# Patient Record
Sex: Female | Born: 1990 | Race: Black or African American | Hispanic: No | Marital: Single | Smoking: Former smoker
Health system: Southern US, Community
[De-identification: ages and names within clinical notes are randomized; demographics above are authoritative.]

## PROBLEM LIST (undated history)

## (undated) ENCOUNTER — Inpatient Hospital Stay (HOSPITAL_COMMUNITY): Payer: Self-pay

## (undated) DIAGNOSIS — R519 Headache, unspecified: Secondary | ICD-10-CM

## (undated) DIAGNOSIS — K2970 Gastritis, unspecified, without bleeding: Secondary | ICD-10-CM

## (undated) DIAGNOSIS — B999 Unspecified infectious disease: Secondary | ICD-10-CM

## (undated) DIAGNOSIS — N83209 Unspecified ovarian cyst, unspecified side: Secondary | ICD-10-CM

## (undated) DIAGNOSIS — D649 Anemia, unspecified: Secondary | ICD-10-CM

## (undated) DIAGNOSIS — R51 Headache: Secondary | ICD-10-CM

## (undated) DIAGNOSIS — R87629 Unspecified abnormal cytological findings in specimens from vagina: Secondary | ICD-10-CM

## (undated) DIAGNOSIS — R112 Nausea with vomiting, unspecified: Secondary | ICD-10-CM

## (undated) DIAGNOSIS — B001 Herpesviral vesicular dermatitis: Secondary | ICD-10-CM

## (undated) DIAGNOSIS — B009 Herpesviral infection, unspecified: Secondary | ICD-10-CM

## (undated) DIAGNOSIS — F12988 Cannabis use, unspecified with other cannabis-induced disorder: Secondary | ICD-10-CM

## (undated) HISTORY — PX: DILATION AND CURETTAGE OF UTERUS: SHX78

---
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
## 2010-09-20 LAB — WET PREP, GENITAL
Clue Cells Wet Prep HPF POC: NONE SEEN
Trich, Wet Prep: NONE SEEN
Yeast Wet Prep HPF POC: NONE SEEN

## 2010-09-20 LAB — LIPASE, BLOOD: Lipase: 13 U/L (ref 11–59)

## 2010-09-20 LAB — DIFFERENTIAL
Basophils Absolute: 0 10*3/uL (ref 0.0–0.1)
Lymphocytes Relative: 32 % (ref 24–48)
Lymphs Abs: 3 10*3/uL (ref 1.1–4.8)
Monocytes Absolute: 0.9 10*3/uL (ref 0.2–1.2)
Neutro Abs: 5.4 10*3/uL (ref 1.7–8.0)

## 2010-09-20 LAB — COMPREHENSIVE METABOLIC PANEL
AST: 30 U/L (ref 0–37)
Albumin: 4.2 g/dL (ref 3.5–5.2)
BUN: 6 mg/dL (ref 6–23)
Calcium: 9.5 mg/dL (ref 8.4–10.5)
Chloride: 100 mEq/L (ref 96–112)
Creatinine, Ser: 0.81 mg/dL (ref 0.4–1.2)
Total Bilirubin: 0.9 mg/dL (ref 0.3–1.2)

## 2010-09-20 LAB — URINALYSIS, ROUTINE W REFLEX MICROSCOPIC
Nitrite: NEGATIVE
Specific Gravity, Urine: 1.017 (ref 1.005–1.030)
Urobilinogen, UA: 1 mg/dL (ref 0.0–1.0)
pH: 6.5 (ref 5.0–8.0)

## 2010-09-20 LAB — URINE CULTURE: Culture: NO GROWTH

## 2010-09-20 LAB — CBC
HCT: 38.7 % (ref 36.0–49.0)
MCHC: 33.1 g/dL (ref 31.0–37.0)
MCV: 83.5 fL (ref 78.0–98.0)
Platelets: 269 10*3/uL (ref 150–400)
WBC: 9.4 10*3/uL (ref 4.5–13.5)

## 2010-09-20 LAB — URINE MICROSCOPIC-ADD ON

## 2012-05-26 ENCOUNTER — Encounter (HOSPITAL_BASED_OUTPATIENT_CLINIC_OR_DEPARTMENT_OTHER): Payer: Self-pay | Admitting: Emergency Medicine

## 2012-05-26 ENCOUNTER — Emergency Department (HOSPITAL_BASED_OUTPATIENT_CLINIC_OR_DEPARTMENT_OTHER)
Admission: EM | Admit: 2012-05-26 | Discharge: 2012-05-26 | Disposition: A | Discharge status: Home / Self Care | Payer: Self-pay | Attending: Emergency Medicine | Admitting: Emergency Medicine

## 2012-05-26 DIAGNOSIS — R112 Nausea with vomiting, unspecified: Secondary | ICD-10-CM | POA: Insufficient documentation

## 2012-05-26 DIAGNOSIS — Z79899 Other long term (current) drug therapy: Secondary | ICD-10-CM | POA: Insufficient documentation

## 2012-05-26 DIAGNOSIS — R1084 Generalized abdominal pain: Secondary | ICD-10-CM | POA: Insufficient documentation

## 2012-05-26 DIAGNOSIS — R109 Unspecified abdominal pain: Secondary | ICD-10-CM

## 2012-05-26 DIAGNOSIS — R197 Diarrhea, unspecified: Secondary | ICD-10-CM | POA: Insufficient documentation

## 2012-05-26 DIAGNOSIS — Z8619 Personal history of other infectious and parasitic diseases: Secondary | ICD-10-CM | POA: Insufficient documentation

## 2012-05-26 DIAGNOSIS — Z8719 Personal history of other diseases of the digestive system: Secondary | ICD-10-CM | POA: Insufficient documentation

## 2012-05-26 HISTORY — DX: Herpesviral vesicular dermatitis: B00.1

## 2012-05-26 HISTORY — DX: Gastritis, unspecified, without bleeding: K29.70

## 2012-05-26 LAB — URINALYSIS, ROUTINE W REFLEX MICROSCOPIC
Leukocytes, UA: NEGATIVE
Nitrite: NEGATIVE
Specific Gravity, Urine: 1.015 (ref 1.005–1.030)
Urobilinogen, UA: 1 mg/dL (ref 0.0–1.0)
pH: 6.5 (ref 5.0–8.0)

## 2012-05-26 LAB — PREGNANCY, URINE: Preg Test, Ur: NEGATIVE

## 2012-05-26 LAB — URINE MICROSCOPIC-ADD ON

## 2012-05-26 MED ORDER — OXYCODONE-ACETAMINOPHEN 5-325 MG PO TABS
1.0000 | ORAL_TABLET | Freq: Once | ORAL | Status: AC
  Administered 2012-05-26: 1 via ORAL
  Filled 2012-05-26 (×2): qty 1

## 2012-05-26 MED ORDER — ONDANSETRON 8 MG PO TBDP
8.0000 mg | ORAL_TABLET | Freq: Once | ORAL | Status: AC
  Administered 2012-05-26: 8 mg via ORAL
  Filled 2012-05-26: qty 1

## 2012-05-26 MED ORDER — PROMETHAZINE HCL 25 MG PO TABS: 25.0000 mg | ORAL_TABLET | Freq: Four times a day (QID) | ORAL | Status: DC | PRN

## 2012-05-26 NOTE — ED Provider Notes (Signed)
History     CSN: [id number]  Arrival date & time 05/26/12  1213   First MD Initiated Contact with Patient 05/26/12 1234      Chief Complaint  Patient presents with  . Abdominal Pain     Patient is a 21 y.o. female presenting with abdominal pain. The history is provided by the patient and a friend.  Abdominal Pain The primary symptoms of the illness include abdominal pain, nausea, vomiting and diarrhea. The primary symptoms of the illness do not include fever, fatigue, shortness of breath, dysuria or vaginal bleeding. The current episode started more than 2 days ago. The onset of the illness was gradual. The problem has been gradually worsening.  Additional symptoms associated with the illness include back pain. Symptoms associated with the illness do not include frequency.  pt reports she has had intermittent abdominal pain for past month . She reports in past 24 hours the pain has returned and she now has vomiting and diarrhea.  The stool is nonbloody.  No vag bleeding/discharge No dysuria She reports she has presented to [facility] 4 times in the past month with extensive workup without cause found for her abdominal pain.  She requests second opinion   Past Medical History  Diagnosis Date  . Gastritis   . Fever blister     Past Surgical History  Procedure Date  . Dilation and curettage of uterus   . Induced abortion     No family history on file.  History  Substance Use Topics  . Smoking status: Not on file  . Smokeless tobacco: Not on file  . Alcohol Use:     OB History    Grav Para Term Preterm Abortions TAB SAB Ect Mult Living                  Review of Systems  Constitutional: Negative for fever and fatigue.  Respiratory: Negative for shortness of breath.   Gastrointestinal: Positive for nausea, vomiting, abdominal pain and diarrhea.  Genitourinary: Negative for dysuria, frequency and vaginal bleeding.  Musculoskeletal: Positive for back pain.   Neurological: Negative for weakness.  Psychiatric/Behavioral: Negative for agitation.  All other systems reviewed and are negative.    Allergies  Review of patient's allergies indicates no known allergies.  Home Medications   Current Outpatient Rx  Name  Route  Sig  Dispense  Refill  . VALACYCLOVIR HCL 1 G PO TABS   Oral   Take 1,000 mg by mouth 2 (two) times daily.           BP 113/77  Pulse 68  Temp 98.3 F (36.8 C) (Oral)  Resp 14  Ht 5\' 4"  (1.626 m)  Wt 130 lb (58.968 kg)  BMI 22.31 kg/m2  SpO2 100%  LMP 05/13/2012  Physical Exam CONSTITUTIONAL: Well developed/well nourished HEAD AND FACE: Normocephalic/atraumatic EYES: EOMI/PERRL, no icterus ENMT: Mucous membranes moist NECK: supple no meningeal signs SPINE:entire spine nontender CV: S1/S2 noted, no murmurs/rubs/gallops noted LUNGS: Lungs are clear to auscultation bilaterally, no apparent distress ABDOMEN: soft, nontender, no rebound or guarding, +BS GU:no cva tenderness NEURO: Pt is awake/alert, moves all extremitiesx4 EXTREMITIES: pulses normal, full ROM SKIN: warm, color normal PSYCH: no abnormalities of mood noted    ED Course  Procedures  Labs Reviewed  URINALYSIS, ROUTINE W REFLEX MICROSCOPIC - Abnormal; Notable for the following:    APPearance CLOUDY (*)     Hgb urine dipstick MODERATE (*)     All other components within  normal limits  URINE MICROSCOPIC-ADD ON - Abnormal; Notable for the following:    Squamous Epithelial / LPF FEW (*)     Bacteria, UA MANY (*)     All other components within normal limits  PREGNANCY, URINE   1:26 PM Pt h/o abd pain for past month She has been seen multiple times at [facility] Will call for those records She is well appearing She mentions 10lb wt loss over past month 1:45 PM [facility] ER visit 12/13 - cbc/cmp negative.  US abdomen performed and negative per records ER visit 05/13/12 - CT abd/pelvis - unremarkable on 12/1 ER visit - 04/29/12 -  labs performed 2:01 PM Discussed results from [facility] hospital.  Do not feel further workup indicated.  Only thing I would add is a pelvic exam (does not appear that she had one on recent ER visit) but she declines.  She reports most of her pain is epigastric burning into her chest.  She is well appearing/nontoxic She requests rx for phenergen.  Given GI referral  MDM  Nursing notes including past medical history and social history reviewed and considered in documentation Labs/vital reviewed and considered         Joya Gaskins, MD 05/26/12 1402

## 2012-05-26 NOTE — ED Notes (Signed)
Generalized abdominal pain, with nausea, and vomiting.  Some diarrhea.  No vaginal discharge.  Seen at [facility] for same for last month.

## 2012-12-10 ENCOUNTER — Encounter (HOSPITAL_COMMUNITY): Payer: Self-pay

## 2012-12-10 ENCOUNTER — Emergency Department (HOSPITAL_COMMUNITY): Payer: No Typology Code available for payment source

## 2012-12-10 ENCOUNTER — Emergency Department (HOSPITAL_COMMUNITY)
Admission: EM | Admit: 2012-12-10 | Discharge: 2012-12-10 | Disposition: A | Discharge status: Home / Self Care | Payer: No Typology Code available for payment source | Attending: Emergency Medicine | Admitting: Emergency Medicine

## 2012-12-10 DIAGNOSIS — S8990XA Unspecified injury of unspecified lower leg, initial encounter: Secondary | ICD-10-CM | POA: Insufficient documentation

## 2012-12-10 DIAGNOSIS — S161XXA Strain of muscle, fascia and tendon at neck level, initial encounter: Secondary | ICD-10-CM

## 2012-12-10 DIAGNOSIS — Y92410 Unspecified street and highway as the place of occurrence of the external cause: Secondary | ICD-10-CM | POA: Insufficient documentation

## 2012-12-10 DIAGNOSIS — S99919A Unspecified injury of unspecified ankle, initial encounter: Secondary | ICD-10-CM | POA: Insufficient documentation

## 2012-12-10 DIAGNOSIS — Z8719 Personal history of other diseases of the digestive system: Secondary | ICD-10-CM | POA: Insufficient documentation

## 2012-12-10 DIAGNOSIS — B009 Herpesviral infection, unspecified: Secondary | ICD-10-CM | POA: Insufficient documentation

## 2012-12-10 DIAGNOSIS — S139XXA Sprain of joints and ligaments of unspecified parts of neck, initial encounter: Secondary | ICD-10-CM | POA: Insufficient documentation

## 2012-12-10 DIAGNOSIS — Y939 Activity, unspecified: Secondary | ICD-10-CM | POA: Insufficient documentation

## 2012-12-10 DIAGNOSIS — Z79899 Other long term (current) drug therapy: Secondary | ICD-10-CM | POA: Insufficient documentation

## 2012-12-10 DIAGNOSIS — S298XXA Other specified injuries of thorax, initial encounter: Secondary | ICD-10-CM | POA: Insufficient documentation

## 2012-12-10 DIAGNOSIS — M25572 Pain in left ankle and joints of left foot: Secondary | ICD-10-CM

## 2012-12-10 HISTORY — DX: Herpesviral infection, unspecified: B00.9

## 2012-12-10 MED ORDER — OXYCODONE-ACETAMINOPHEN 5-325 MG PO TABS
1.0000 | ORAL_TABLET | Freq: Once | ORAL | Status: AC
  Administered 2012-12-10: 1 via ORAL
  Filled 2012-12-10: qty 1

## 2012-12-10 MED ORDER — IBUPROFEN 600 MG PO TABS: 600.0000 mg | ORAL_TABLET | Freq: Three times a day (TID) | ORAL | Status: DC | PRN

## 2012-12-10 NOTE — ED Notes (Signed)
Patient is resting comfortably. 
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
## 2014-11-12 ENCOUNTER — Encounter (HOSPITAL_BASED_OUTPATIENT_CLINIC_OR_DEPARTMENT_OTHER): Payer: Self-pay | Admitting: Emergency Medicine

## 2014-11-12 ENCOUNTER — Emergency Department (HOSPITAL_BASED_OUTPATIENT_CLINIC_OR_DEPARTMENT_OTHER)
Admission: EM | Admit: 2014-11-12 | Discharge: 2014-11-12 | Disposition: A | Discharge status: Home / Self Care | Payer: Self-pay | Attending: Emergency Medicine | Admitting: Emergency Medicine

## 2014-11-12 DIAGNOSIS — K088 Other specified disorders of teeth and supporting structures: Secondary | ICD-10-CM | POA: Insufficient documentation

## 2014-11-12 DIAGNOSIS — Z8719 Personal history of other diseases of the digestive system: Secondary | ICD-10-CM | POA: Insufficient documentation

## 2014-11-12 DIAGNOSIS — Z79899 Other long term (current) drug therapy: Secondary | ICD-10-CM | POA: Insufficient documentation

## 2014-11-12 DIAGNOSIS — K0889 Other specified disorders of teeth and supporting structures: Secondary | ICD-10-CM

## 2014-11-12 DIAGNOSIS — Z720 Tobacco use: Secondary | ICD-10-CM | POA: Insufficient documentation

## 2014-11-12 DIAGNOSIS — Z8619 Personal history of other infectious and parasitic diseases: Secondary | ICD-10-CM | POA: Insufficient documentation

## 2014-11-12 MED ORDER — PENICILLIN V POTASSIUM 250 MG PO TABS: 250.0000 mg | ORAL_TABLET | Freq: Four times a day (QID) | ORAL | Status: AC

## 2014-11-12 NOTE — ED Notes (Signed)
Pt states tooth pain for about 1 week

## 2014-11-12 NOTE — ED Provider Notes (Signed)
CSN: [id number]     Arrival date & time 11/12/14  1755 History   First MD Initiated Contact with Patient 11/12/14 1801     Chief Complaint  Patient presents with  . Dental Pain     (Consider location/radiation/quality/duration/timing/severity/associated sxs/prior Treatment) HPI Comments: Pt comes in with c/o left lower tooth fracture that happened about 1 week ago and pt states that in the last couple of days the pain has gotten worse. No history of problems with that tooth. No facial swelling or problems swallowing or breathing  The history is provided by the patient. No language interpreter was used.    Past Medical History  Diagnosis Date  . Gastritis   . Fever blister   . Herpes    Past Surgical History  Procedure Laterality Date  . Dilation and curettage of uterus    . Induced abortion     History reviewed. No pertinent family history. History  Substance Use Topics  . Smoking status: Current Every Day Smoker  . Smokeless tobacco: Not on file  . Alcohol Use: Yes   OB History    No data available     Review of Systems  All other systems reviewed and are negative.     Allergies  Cherry  Home Medications   Prior to Admission medications   Medication Sig Start Date End Date Taking? Authorizing Provider  ibuprofen (ADVIL,MOTRIN) 600 MG tablet Take 1 tablet (600 mg total) by mouth every 8 (eight) hours as needed for pain. 12/10/12   Azalia BilisKevin Campos, MD  penicillin v potassium (VEETID) 250 MG tablet Take 1 tablet (250 mg total) by mouth 4 (four) times daily. 11/12/14 11/19/14  Teressa LowerVrinda Lemmie Vanlanen, NP  valACYclovir (VALTREX) 1000 MG tablet Take 1,000 mg by mouth daily.     Historical Provider, MD   BP 120/80 mmHg  Pulse 86  Temp(Src) 98.5 F (36.9 C) (Oral)  Resp 18  Ht 5\' 4"  (1.626 m)  Wt 125 lb (56.7 kg)  BMI 21.45 kg/m2  SpO2 100%  LMP 11/12/2014 Physical Exam  Constitutional: She appears well-developed and well-nourished.  HENT:  Right Ear: External ear normal.    Mouth/Throat:    Cardiovascular: Normal rate and regular rhythm.   Pulmonary/Chest: Effort normal and breath sounds normal.  Musculoskeletal: Normal range of motion.  Nursing note and vitals reviewed.   ED Course  Procedures (including critical care time) Labs Review Labs Reviewed - No data to display  Imaging Review No results found.   EKG Interpretation None      MDM   Final diagnoses:  Toothache    Pt given script for pcn and dental referral. No sign of ludwigs angina   Teressa LowerVrinda Sharnelle Cappelli, NP 11/12/14 1817  Nelva Nayobert Beaton, MD 11/17/14 2157

## 2014-11-12 NOTE — Discharge Instructions (Signed)

## 2015-05-14 ENCOUNTER — Emergency Department (HOSPITAL_BASED_OUTPATIENT_CLINIC_OR_DEPARTMENT_OTHER)
Admission: EM | Admit: 2015-05-14 | Discharge: 2015-05-14 | Disposition: A | Discharge status: Home / Self Care | Payer: Self-pay | Attending: Emergency Medicine | Admitting: Emergency Medicine

## 2015-05-14 ENCOUNTER — Encounter (HOSPITAL_BASED_OUTPATIENT_CLINIC_OR_DEPARTMENT_OTHER): Payer: Self-pay | Admitting: *Deleted

## 2015-05-14 DIAGNOSIS — K029 Dental caries, unspecified: Secondary | ICD-10-CM | POA: Insufficient documentation

## 2015-05-14 DIAGNOSIS — K0889 Other specified disorders of teeth and supporting structures: Secondary | ICD-10-CM | POA: Insufficient documentation

## 2015-05-14 DIAGNOSIS — F17200 Nicotine dependence, unspecified, uncomplicated: Secondary | ICD-10-CM | POA: Insufficient documentation

## 2015-05-14 DIAGNOSIS — Z79899 Other long term (current) drug therapy: Secondary | ICD-10-CM | POA: Insufficient documentation

## 2015-05-14 DIAGNOSIS — Z8619 Personal history of other infectious and parasitic diseases: Secondary | ICD-10-CM | POA: Insufficient documentation

## 2015-05-14 DIAGNOSIS — Z8719 Personal history of other diseases of the digestive system: Secondary | ICD-10-CM | POA: Insufficient documentation

## 2015-05-14 MED ORDER — HYDROCODONE-ACETAMINOPHEN 5-325 MG PO TABS: 1.0000 | ORAL_TABLET | Freq: Four times a day (QID) | ORAL | Status: DC | PRN

## 2015-05-14 MED ORDER — PENICILLIN V POTASSIUM 250 MG PO TABS: 250.0000 mg | ORAL_TABLET | Freq: Four times a day (QID) | ORAL | Status: AC

## 2015-05-14 NOTE — ED Provider Notes (Addendum)
CSN: [id number]     Arrival date & time 05/14/15  1415 History   First MD Initiated Contact with Patient 05/14/15 1426     Chief Complaint  Patient presents with  . Dental Pain     (Consider location/radiation/quality/duration/timing/severity/associated sxs/prior Treatment) Patient is a 24 y.o. female presenting with tooth pain. The history is provided by the patient.  Dental Pain Location:  Lower Lower teeth location:  18/LL 2nd molar Quality:  Pressure-like, localized and pulsating Severity:  Severe Onset quality:  Gradual Duration:  3 days Timing:  Constant Progression:  Worsening Chronicity:  New Context: filling fell out   Context comment:  Patient has dental decay in her filling had come out. She saw her dentist and had the tooth extracted 3 days ago. She did not receive any antibiotics or pain control and since that time the pain has worsened with ongoing bleeding. Relieved by:  Nothing Worsened by:  Hot food/drink and cold food/drink Ineffective treatments:  Acetaminophen Associated symptoms: facial pain, facial swelling and oral bleeding   Associated symptoms: no difficulty swallowing, no drooling, no fever and no gum swelling     Past Medical History  Diagnosis Date  . Gastritis   . Fever blister   . Herpes    Past Surgical History  Procedure Laterality Date  . Dilation and curettage of uterus    . Induced abortion     No family history on file. Social History  Substance Use Topics  . Smoking status: Current Every Day Smoker  . Smokeless tobacco: None  . Alcohol Use: Yes   OB History    No data available     Review of Systems  Constitutional: Negative for fever.  HENT: Positive for facial swelling. Negative for drooling.   All other systems reviewed and are negative.     Allergies  Cherry  Home Medications   Prior to Admission medications   Medication Sig Start Date End Date Taking? Authorizing Provider  ibuprofen (ADVIL,MOTRIN) 600 MG  tablet Take 1 tablet (600 mg total) by mouth every 8 (eight) hours as needed for pain. 12/10/12   Azalia Bilis, MD  valACYclovir (VALTREX) 1000 MG tablet Take 1,000 mg by mouth daily.     Historical Provider, MD   BP 129/82 mmHg  Pulse 88  Temp(Src) 98.2 F (36.8 C) (Oral)  Resp 16  SpO2 98%  LMP 05/11/2015 Physical Exam  Constitutional: She is oriented to person, place, and time. She appears well-developed and well-nourished.  Appears uncomfortable  HENT:  Head: Normocephalic and atraumatic.  Mouth/Throat:    Cardiovascular: Normal rate.   Pulmonary/Chest: Effort normal.  Neurological: She is alert and oriented to person, place, and time.  Skin: Skin is warm and dry.  Psychiatric: She has a normal mood and affect. Her behavior is normal.  Nursing note and vitals reviewed.   ED Course  Procedures (including critical care time) Labs Review Labs Reviewed - No data to display  Imaging Review No results found. I have personally reviewed and evaluated these images and lab results as part of my medical decision-making.   EKG Interpretation None      MDM   Final diagnoses:  Pain, dental   patient with tooth extraction 2 days ago with ongoing pain and bleeding. She has been packing it however that is not helping. The tooth was apparently decayed when it was removed. On exam patient has no current bleeding or signs for abscess however will treat with penicillin and pain control  Gwyneth SproutWhitney Jerel Sardina, MD 05/14/15 1440  Gwyneth SproutWhitney Elbia Paro, MD 05/14/15 [phone]

## 2015-05-14 NOTE — ED Notes (Signed)
She had a molar pulled 2 days ago. Pain and bleeding since.

## 2015-07-14 ENCOUNTER — Emergency Department (HOSPITAL_BASED_OUTPATIENT_CLINIC_OR_DEPARTMENT_OTHER)
Admission: EM | Admit: 2015-07-14 | Discharge: 2015-07-14 | Disposition: A | Discharge status: Home / Self Care | Payer: Self-pay | Attending: Emergency Medicine | Admitting: Emergency Medicine

## 2015-07-14 ENCOUNTER — Emergency Department (HOSPITAL_BASED_OUTPATIENT_CLINIC_OR_DEPARTMENT_OTHER): Payer: Self-pay

## 2015-07-14 ENCOUNTER — Encounter (HOSPITAL_BASED_OUTPATIENT_CLINIC_OR_DEPARTMENT_OTHER): Payer: Self-pay | Admitting: Emergency Medicine

## 2015-07-14 DIAGNOSIS — O200 Threatened abortion: Secondary | ICD-10-CM | POA: Insufficient documentation

## 2015-07-14 DIAGNOSIS — O209 Hemorrhage in early pregnancy, unspecified: Secondary | ICD-10-CM

## 2015-07-14 DIAGNOSIS — Z8719 Personal history of other diseases of the digestive system: Secondary | ICD-10-CM | POA: Insufficient documentation

## 2015-07-14 DIAGNOSIS — Z3A01 Less than 8 weeks gestation of pregnancy: Secondary | ICD-10-CM | POA: Insufficient documentation

## 2015-07-14 DIAGNOSIS — F17200 Nicotine dependence, unspecified, uncomplicated: Secondary | ICD-10-CM | POA: Insufficient documentation

## 2015-07-14 DIAGNOSIS — Z79899 Other long term (current) drug therapy: Secondary | ICD-10-CM | POA: Insufficient documentation

## 2015-07-14 DIAGNOSIS — Z8619 Personal history of other infectious and parasitic diseases: Secondary | ICD-10-CM | POA: Insufficient documentation

## 2015-07-14 DIAGNOSIS — O99331 Smoking (tobacco) complicating pregnancy, first trimester: Secondary | ICD-10-CM | POA: Insufficient documentation

## 2015-07-14 LAB — BASIC METABOLIC PANEL
ANION GAP: 9 (ref 5–15)
BUN: 6 mg/dL (ref 6–20)
CALCIUM: 8.8 mg/dL — AB (ref 8.9–10.3)
CO2: 24 mmol/L (ref 22–32)
CREATININE: 0.59 mg/dL (ref 0.44–1.00)
Chloride: 103 mmol/L (ref 101–111)
GFR calc non Af Amer: >60 mL/min (ref >60)
Glucose, Bld: 101 mg/dL — ABNORMAL HIGH (ref 65–99)
Potassium: 3.4 mmol/L — ABNORMAL LOW (ref 3.5–5.1)
SODIUM: 136 mmol/L (ref 135–145)

## 2015-07-14 LAB — CBC WITH DIFFERENTIAL/PLATELET
BASOS ABS: 0 10*3/uL (ref 0.0–0.1)
BASOS PCT: 0 %
EOS ABS: 0.2 10*3/uL (ref 0.0–0.7)
Eosinophils Relative: 2 %
HEMATOCRIT: 33.3 % — AB (ref 36.0–46.0)
HEMOGLOBIN: 11 g/dL — AB (ref 12.0–15.0)
Lymphocytes Relative: 32 %
Lymphs Abs: 3 10*3/uL (ref 0.7–4.0)
MCH: 27.8 pg (ref 26.0–34.0)
MCHC: 33 g/dL (ref 30.0–36.0)
MCV: 84.3 fL (ref 78.0–100.0)
MONOS PCT: 12 %
Monocytes Absolute: 1.1 10*3/uL — ABNORMAL HIGH (ref 0.1–1.0)
NEUTROS ABS: 4.9 10*3/uL (ref 1.7–7.7)
NEUTROS PCT: 54 %
Platelets: 262 10*3/uL (ref 150–400)
RBC: 3.95 MIL/uL (ref 3.87–5.11)
RDW: 15.5 % (ref 11.5–15.5)
WBC: 9.2 10*3/uL (ref 4.0–10.5)

## 2015-07-14 LAB — URINE MICROSCOPIC-ADD ON: WBC UA: NONE SEEN WBC/hpf (ref 0–5)

## 2015-07-14 LAB — URINALYSIS, ROUTINE W REFLEX MICROSCOPIC
Bilirubin Urine: NEGATIVE
GLUCOSE, UA: NEGATIVE mg/dL
KETONES UR: NEGATIVE mg/dL
LEUKOCYTES UA: NEGATIVE
Nitrite: NEGATIVE
PH: 7 (ref 5.0–8.0)
Protein, ur: NEGATIVE mg/dL
Specific Gravity, Urine: 1.002 — ABNORMAL LOW (ref 1.005–1.030)

## 2015-07-14 LAB — ABO/RH: ABO/RH(D): O POS

## 2015-07-14 LAB — PREGNANCY, URINE: Preg Test, Ur: POSITIVE — AB

## 2015-07-14 LAB — HCG, QUANTITATIVE, PREGNANCY: HCG, BETA CHAIN, QUANT, S: 324 m[IU]/mL — AB (ref <5)

## 2015-07-14 NOTE — Discharge Instructions (Signed)
Follow-up with an obstetrician in the next 2-3 days. Please call to arrange this appointment.  Return to the ER if symptoms significantly worsen or change.   Threatened Miscarriage A threatened miscarriage occurs when you have vaginal bleeding during your first 20 weeks of pregnancy but the pregnancy has not ended. If you have vaginal bleeding during this time, your health care provider will do tests to make sure you are still pregnant. If the tests show you are still pregnant and the developing baby (fetus) inside your womb (uterus) is still growing, your condition is considered a threatened miscarriage. A threatened miscarriage does not mean your pregnancy will end, but it does increase the risk of losing your pregnancy (complete miscarriage). CAUSES  The cause of a threatened miscarriage is usually not known. If you go on to have a complete miscarriage, the most common cause is an abnormal number of chromosomes in the developing baby. Chromosomes are the structures inside cells that hold all your genetic material. Some causes of vaginal bleeding that do not result in miscarriage include:  Having sex.  Having an infection.  Normal hormone changes of pregnancy.  Bleeding that occurs when an egg implants in your uterus. RISK FACTORS Risk factors for bleeding in early pregnancy include:  Obesity.  Smoking.  Drinking excessive amounts of alcohol or caffeine.  Recreational drug use. SIGNS AND SYMPTOMS  Light vaginal bleeding.  Mild abdominal pain or cramps. DIAGNOSIS  If you have bleeding with or without abdominal pain before 20 weeks of pregnancy, your health care provider will do tests to check whether you are still pregnant. One important test involves using sound waves and a computer (ultrasound) to create images of the inside of your uterus. Other tests include an internal exam of your vagina and uterus (pelvic exam) and measurement of your baby's heart rate.  You may be  diagnosed with a threatened miscarriage if:  Ultrasound testing shows you are still pregnant.  Your baby's heart rate is strong.  A pelvic exam shows that the opening between your uterus and your vagina (cervix) is closed.  Your heart rate and blood pressure are stable.  Blood tests confirm you are still pregnant. TREATMENT  No treatments have been shown to prevent a threatened miscarriage from going on to a complete miscarriage. However, the right home care is important.  HOME CARE INSTRUCTIONS   Make sure you keep all your appointments for prenatal care. This is very important.  Get plenty of rest.  Do not have sex or use tampons if you have vaginal bleeding.  Do not douche.  Do not smoke or use recreational drugs.  Do not drink alcohol.  Avoid caffeine. SEEK MEDICAL CARE IF:  You have light vaginal bleeding or spotting while pregnant.  You have abdominal pain or cramping.  You have a fever. SEEK IMMEDIATE MEDICAL CARE IF:  You have heavy vaginal bleeding.  You have blood clots coming from your vagina.  You have severe low back pain or abdominal cramps.  You have fever, chills, and severe abdominal pain. MAKE SURE YOU:  Understand these instructions.  Will watch your condition.  Will get help right away if you are not doing well or get worse.   This information is not intended to replace advice given to you by your health care provider. Make sure you discuss any questions you have with your health care provider.   Document Released: 05/30/2005 Document Revised: 06/04/2013 Document Reviewed: 03/26/2013 Elsevier Interactive Patient Education [employer]

## 2015-07-14 NOTE — ED Notes (Signed)
Pt in c/o vaginal bleeding onset today after having 3 positive pregnancy tests. Also has cramping like menstrual cramps.

## 2015-07-14 NOTE — ED Provider Notes (Signed)
CSN: [id number]     Arrival date & time 07/14/15  1948 History  By signing my name below, I, Sarah Johns, attest that this documentation has been prepared under the direction and in the presence of Sarah Lyons, MD. Electronically Signed: Doreatha Johns, ED Scribe. 07/14/2015. 8:54 PM.     Chief Complaint  Patient presents with  . Vaginal Bleeding  . Possible Pregnancy   Patient is a 25 y.o. female presenting with vaginal bleeding and pregnancy problem. The history is provided by the patient. No language interpreter was used.  Vaginal Bleeding Quality:  Heavier than menses Severity:  Moderate Onset quality:  Gradual Duration:  12 hours Timing:  Constant Progression:  Improving Chronicity:  New Menstrual history:  Missed period Number of pads used:  3 Possible pregnancy: yes   Context: spontaneously   Relieved by:  None tried Worsened by:  Nothing tried Ineffective treatments:  None tried Associated symptoms: abdominal pain   Associated symptoms: no back pain   Risk factors: unprotected sex   Risk factors: no hx of ectopic pregnancy, no prior miscarriage and no terminated pregnancies   Possible Pregnancy Associated symptoms include abdominal pain.    HPI Comments: Sarah Johns is a 25 y.o. female No obstetric history on file. who presents to the Emergency Department complaining of moderate vaginal bleeding onset this evening with associated lower abdominal cramping. She states her bleeding was heavy initially and has eased off since onset. Per pt, she took 3 positive pregnancy tests this morning before the onset of bleeding. Pt notes she was not actively trying to conceive. LNMP 05/28/15. Pt notes that she has an appointment with her OB/GYN 07/22/15. Otherwise healthy. No daily medications. She denies back pain.   Past Medical History  Diagnosis Date  . Gastritis   . Fever blister   . Herpes    Past Surgical History  Procedure Laterality Date  . Dilation and curettage of  uterus    . Induced abortion     History reviewed. No pertinent family history. Social History  Substance Use Topics  . Smoking status: Current Every Day Smoker  . Smokeless tobacco: None  . Alcohol Use: Yes   OB History    No data available     Review of Systems  Gastrointestinal: Positive for abdominal pain.  Genitourinary: Positive for vaginal bleeding.  Musculoskeletal: Negative for back pain.  All other systems reviewed and are negative.  Allergies  Cherry  Home Medications   Prior to Admission medications   Medication Sig Start Date End Date Taking? Authorizing Provider  HYDROcodone-acetaminophen (NORCO/VICODIN) 5-325 MG tablet Take 1-2 tablets by mouth every 6 (six) hours as needed. 05/14/15   Gwyneth Sprout, MD  ibuprofen (ADVIL,MOTRIN) 600 MG tablet Take 1 tablet (600 mg total) by mouth every 8 (eight) hours as needed for pain. 12/10/12   Azalia Bilis, MD  valACYclovir (VALTREX) 1000 MG tablet Take 1,000 mg by mouth daily.     Historical Provider, MD   BP 128/86 mmHg  Pulse 96  Temp(Src) 98.6 F (37 C) (Oral)  Resp 18  Ht  (1.626 m)  Wt 136 lb (61.689 kg)  BMI 23.33 kg/m2  SpO2 100% Physical Exam  Constitutional: She is oriented to person, place, and time. She appears well-developed and well-nourished. No distress.  HENT:  Head: Normocephalic and atraumatic.  Eyes: Conjunctivae and EOM are normal. Pupils are equal, round, and reactive to light.  Neck: Normal range of motion.  Cardiovascular: Normal rate, regular rhythm  and normal heart sounds.   Pulmonary/Chest: Effort normal and breath sounds normal. No respiratory distress. She has no wheezes. She has no rales.  Abdominal: Soft. She exhibits no distension and no mass. There is no tenderness. There is no rebound and no guarding.  Musculoskeletal: Normal range of motion.  Neurological: She is alert and oriented to person, place, and time.  Skin: Skin is warm and dry.  Psychiatric: She has a normal  mood and affect. Judgment normal.  Nursing note and vitals reviewed.   ED Course  Procedures (including critical care time) DIAGNOSTIC STUDIES: Oxygen Saturation is 100% on RA, normal by my interpretation.    COORDINATION OF CARE: 8:49 PM Discussed treatment plan with pt at bedside and pt agreed to plan.   Labs Review Labs Reviewed  URINALYSIS, ROUTINE W REFLEX MICROSCOPIC (NOT AT [facility]) - Abnormal; Notable for the following:    Specific Gravity, Urine 1.002 (*)    Hgb urine dipstick MODERATE (*)    All other components within normal limits  PREGNANCY, URINE - Abnormal; Notable for the following:    Preg Test, Ur POSITIVE (*)    All other components within normal limits  URINE MICROSCOPIC-ADD ON - Abnormal; Notable for the following:    Squamous Epithelial / LPF 0-5 (*)    Bacteria, UA RARE (*)    All other components within normal limits  HCG, QUANTITATIVE, PREGNANCY  BASIC METABOLIC PANEL  CBC WITH DIFFERENTIAL/PLATELET  ABO/RH    Imaging Review No results found. I have personally reviewed and evaluated these images and lab results as part of my medical decision-making.  MDM   Final diagnoses:  None    Patient presents here with complaints of vaginal bleeding and cramping. She took a home pregnancy test which was positive. Her last period was at Christmas time. Her quantitative beta hCG is 324 and ultrasound shows a probable intrauterine gestational sac, but no yolk sac, fetal pole, or cardiac activity. I am uncertain as to whether this represents an early pregnancy or possibly a miscarriage in evolution. She will be advised to follow-up with an OB doctor in the next 2-3 days for repeat beta hCG and ultrasound. She is to adhere to vaginal rest and go to the emergency department if she develops severe pain or other problems.  I personally performed the services described in this documentation, which was scribed in my presence. The recorded information has been reviewed and  is accurate.       Sarah Lyons, MD 07/14/15 [phone]

## 2015-07-14 NOTE — ED Notes (Signed)
Pt verbalizes understanding of d/c instructions and denies any further needs at this time. 

## 2015-07-14 NOTE — ED Notes (Signed)
Patient transported to Ultrasound 

## 2015-07-14 NOTE — ED Notes (Signed)
Pt returned from US

## 2015-07-14 NOTE — ED Notes (Signed)
Pt's LMP was 06/07/15, c/o bleeding that started today with some light cramping.  No clots and pt has gone through 3 pads all day.

## 2015-09-21 ENCOUNTER — Encounter (HOSPITAL_COMMUNITY)
Admission: EM | Disposition: A | Discharge status: Home / Self Care | Payer: Self-pay | Source: Home / Self Care | Attending: Emergency Medicine

## 2015-09-21 ENCOUNTER — Emergency Department (HOSPITAL_BASED_OUTPATIENT_CLINIC_OR_DEPARTMENT_OTHER): Payer: Medicaid Other

## 2015-09-21 ENCOUNTER — Inpatient Hospital Stay (HOSPITAL_COMMUNITY): Payer: Medicaid Other | Admitting: Anesthesiology

## 2015-09-21 ENCOUNTER — Ambulatory Visit (HOSPITAL_BASED_OUTPATIENT_CLINIC_OR_DEPARTMENT_OTHER)
Admission: EM | Admit: 2015-09-21 | Discharge: 2015-09-21 | Disposition: A | Discharge status: Home / Self Care | Payer: Medicaid Other | Attending: Obstetrics & Gynecology | Admitting: Obstetrics & Gynecology

## 2015-09-21 ENCOUNTER — Inpatient Hospital Stay (HOSPITAL_COMMUNITY): Payer: Medicaid Other

## 2015-09-21 ENCOUNTER — Encounter (HOSPITAL_BASED_OUTPATIENT_CLINIC_OR_DEPARTMENT_OTHER): Payer: Self-pay | Admitting: *Deleted

## 2015-09-21 DIAGNOSIS — F17200 Nicotine dependence, unspecified, uncomplicated: Secondary | ICD-10-CM | POA: Insufficient documentation

## 2015-09-21 DIAGNOSIS — O26899 Other specified pregnancy related conditions, unspecified trimester: Secondary | ICD-10-CM

## 2015-09-21 DIAGNOSIS — O001 Tubal pregnancy without intrauterine pregnancy: Secondary | ICD-10-CM | POA: Diagnosis not present

## 2015-09-21 DIAGNOSIS — R102 Pelvic and perineal pain: Secondary | ICD-10-CM | POA: Diagnosis present

## 2015-09-21 DIAGNOSIS — R109 Unspecified abdominal pain: Secondary | ICD-10-CM

## 2015-09-21 DIAGNOSIS — R1031 Right lower quadrant pain: Secondary | ICD-10-CM

## 2015-09-21 HISTORY — PX: DIAGNOSTIC LAPAROSCOPY WITH REMOVAL OF ECTOPIC PREGNANCY: SHX6449

## 2015-09-21 LAB — TYPE AND SCREEN
ABO/RH(D): O POS
ANTIBODY SCREEN: NEGATIVE

## 2015-09-21 LAB — CBC WITH DIFFERENTIAL/PLATELET
BASOS ABS: 0 10*3/uL (ref 0.0–0.1)
BASOS PCT: 0 %
EOS ABS: 0.3 10*3/uL (ref 0.0–0.7)
Eosinophils Relative: 3 %
HEMATOCRIT: 32.5 % — AB (ref 36.0–46.0)
HEMOGLOBIN: 11 g/dL — AB (ref 12.0–15.0)
Lymphocytes Relative: 34 %
Lymphs Abs: 3 10*3/uL (ref 0.7–4.0)
MCH: 29.6 pg (ref 26.0–34.0)
MCHC: 33.8 g/dL (ref 30.0–36.0)
MCV: 87.4 fL (ref 78.0–100.0)
MONOS PCT: 11 %
Monocytes Absolute: 1 10*3/uL (ref 0.1–1.0)
NEUTROS ABS: 4.6 10*3/uL (ref 1.7–7.7)
NEUTROS PCT: 52 %
Platelets: 225 10*3/uL (ref 150–400)
RBC: 3.72 MIL/uL — AB (ref 3.87–5.11)
RDW: 15.6 % — ABNORMAL HIGH (ref 11.5–15.5)
WBC: 8.9 10*3/uL (ref 4.0–10.5)

## 2015-09-21 LAB — URINALYSIS, ROUTINE W REFLEX MICROSCOPIC
Bilirubin Urine: NEGATIVE
GLUCOSE, UA: NEGATIVE mg/dL
HGB URINE DIPSTICK: NEGATIVE
Ketones, ur: NEGATIVE mg/dL
Nitrite: NEGATIVE
PH: 7 (ref 5.0–8.0)
PROTEIN: NEGATIVE mg/dL
Specific Gravity, Urine: 1.007 (ref 1.005–1.030)

## 2015-09-21 LAB — COMPREHENSIVE METABOLIC PANEL
ALBUMIN: 4.3 g/dL (ref 3.5–5.0)
ALT: 15 U/L (ref 14–54)
ANION GAP: 4 — AB (ref 5–15)
AST: 23 U/L (ref 15–41)
Alkaline Phosphatase: 64 U/L (ref 38–126)
BUN: 6 mg/dL (ref 6–20)
CALCIUM: 9 mg/dL (ref 8.9–10.3)
CHLORIDE: 104 mmol/L (ref 101–111)
CO2: 27 mmol/L (ref 22–32)
CREATININE: 0.66 mg/dL (ref 0.44–1.00)
GFR calc non Af Amer: >60 mL/min (ref >60)
GLUCOSE: 93 mg/dL (ref 65–99)
POTASSIUM: 4 mmol/L (ref 3.5–5.1)
SODIUM: 135 mmol/L (ref 135–145)
Total Bilirubin: 0.6 mg/dL (ref 0.3–1.2)
Total Protein: 7.3 g/dL (ref 6.5–8.1)

## 2015-09-21 LAB — URINE MICROSCOPIC-ADD ON

## 2015-09-21 LAB — HCG, QUANTITATIVE, PREGNANCY: HCG, BETA CHAIN, QUANT, S: 637 m[IU]/mL — AB (ref <5)

## 2015-09-21 LAB — PREGNANCY, URINE: Preg Test, Ur: POSITIVE — AB

## 2015-09-21 SURGERY — LAPAROSCOPY, WITH ECTOPIC PREGNANCY SURGICAL TREATMENT
Anesthesia: General | Site: Abdomen

## 2015-09-21 MED ORDER — MIDAZOLAM HCL 2 MG/2ML IJ SOLN
INTRAMUSCULAR | Status: AC
  Filled 2015-09-21: qty 2

## 2015-09-21 MED ORDER — LACTATED RINGERS IV BOLUS (SEPSIS)
1000.0000 mL | Freq: Once | INTRAVENOUS | Status: AC
  Administered 2015-09-21: 1000 mL via INTRAVENOUS
  Administered 2015-09-21: 400 mL via INTRAVENOUS

## 2015-09-21 MED ORDER — ONDANSETRON HCL 4 MG/2ML IJ SOLN
INTRAMUSCULAR | Status: DC | PRN
  Administered 2015-09-21: 4 mg via INTRAVENOUS

## 2015-09-21 MED ORDER — FAMOTIDINE IN NACL 20-0.9 MG/50ML-% IV SOLN
INTRAVENOUS | Status: AC
  Administered 2015-09-21: 20 mg via INTRAVENOUS
  Filled 2015-09-21: qty 50

## 2015-09-21 MED ORDER — MIDAZOLAM HCL 5 MG/5ML IJ SOLN
INTRAMUSCULAR | Status: DC | PRN
  Administered 2015-09-21: 2 mg via INTRAVENOUS

## 2015-09-21 MED ORDER — GLYCOPYRROLATE 0.2 MG/ML IJ SOLN
INTRAMUSCULAR | Status: AC
  Filled 2015-09-21: qty 1

## 2015-09-21 MED ORDER — OXYCODONE-ACETAMINOPHEN 5-325 MG PO TABS
1.0000 | ORAL_TABLET | ORAL | Status: DC | PRN
  Administered 2015-09-21: 1 via ORAL

## 2015-09-21 MED ORDER — IBUPROFEN 600 MG PO TABS
600.0000 mg | ORAL_TABLET | Freq: Four times a day (QID) | ORAL | Status: DC | PRN
Start: 2015-09-21 — End: 2016-06-03

## 2015-09-21 MED ORDER — ONDANSETRON HCL 4 MG/2ML IJ SOLN: 4.0000 mg | Freq: Once | INTRAMUSCULAR | Status: DC | PRN

## 2015-09-21 MED ORDER — FENTANYL CITRATE (PF) 250 MCG/5ML IJ SOLN
INTRAMUSCULAR | Status: AC
  Filled 2015-09-21: qty 5

## 2015-09-21 MED ORDER — GLYCOPYRROLATE 0.2 MG/ML IJ SOLN
INTRAMUSCULAR | Status: DC | PRN
  Administered 2015-09-21: 0.2 mg via INTRAVENOUS
  Administered 2015-09-21: .8 mg via INTRAVENOUS

## 2015-09-21 MED ORDER — NEOSTIGMINE METHYLSULFATE 10 MG/10ML IV SOLN
INTRAVENOUS | Status: AC
  Filled 2015-09-21: qty 1

## 2015-09-21 MED ORDER — FENTANYL CITRATE (PF) 100 MCG/2ML IJ SOLN
25.0000 ug | INTRAMUSCULAR | Status: DC | PRN
  Administered 2015-09-21 (×3): 50 ug via INTRAVENOUS

## 2015-09-21 MED ORDER — GLYCOPYRROLATE 0.2 MG/ML IJ SOLN
INTRAMUSCULAR | Status: AC
  Filled 2015-09-21: qty 4

## 2015-09-21 MED ORDER — DEXAMETHASONE SODIUM PHOSPHATE 4 MG/ML IJ SOLN
INTRAMUSCULAR | Status: DC | PRN
  Administered 2015-09-21: 4 mg via INTRAVENOUS

## 2015-09-21 MED ORDER — OXYCODONE-ACETAMINOPHEN 5-325 MG PO TABS
ORAL_TABLET | ORAL | Status: AC
  Filled 2015-09-21: qty 1

## 2015-09-21 MED ORDER — LACTATED RINGERS IV SOLN
INTRAVENOUS | Status: DC
  Administered 2015-09-21 (×3): via INTRAVENOUS

## 2015-09-21 MED ORDER — SUCCINYLCHOLINE CHLORIDE 20 MG/ML IJ SOLN
INTRAMUSCULAR | Status: AC
  Filled 2015-09-21: qty 1

## 2015-09-21 MED ORDER — LIDOCAINE HCL (CARDIAC) 20 MG/ML IV SOLN
INTRAVENOUS | Status: AC
  Filled 2015-09-21: qty 5

## 2015-09-21 MED ORDER — ROCURONIUM BROMIDE 100 MG/10ML IV SOLN
INTRAVENOUS | Status: DC | PRN
  Administered 2015-09-21: 25 mg via INTRAVENOUS

## 2015-09-21 MED ORDER — FAMOTIDINE IN NACL 20-0.9 MG/50ML-% IV SOLN
20.0000 mg | Freq: Once | INTRAVENOUS | Status: AC
  Administered 2015-09-21: 20 mg via INTRAVENOUS

## 2015-09-21 MED ORDER — FENTANYL CITRATE (PF) 100 MCG/2ML IJ SOLN
INTRAMUSCULAR | Status: AC
  Filled 2015-09-21: qty 2

## 2015-09-21 MED ORDER — PROPOFOL 500 MG/50ML IV EMUL
INTRAVENOUS | Status: DC | PRN
  Administered 2015-09-21 (×2): 17 mL via INTRAVENOUS

## 2015-09-21 MED ORDER — NALBUPHINE HCL 10 MG/ML IJ SOLN
10.0000 mg | Freq: Once | INTRAMUSCULAR | Status: AC
  Administered 2015-09-21: 10 mg via INTRAVENOUS
  Filled 2015-09-21: qty 1

## 2015-09-21 MED ORDER — FENTANYL CITRATE (PF) 100 MCG/2ML IJ SOLN
INTRAMUSCULAR | Status: AC
  Administered 2015-09-21: 50 ug via INTRAVENOUS
  Filled 2015-09-21: qty 2

## 2015-09-21 MED ORDER — NEOSTIGMINE METHYLSULFATE 10 MG/10ML IV SOLN
INTRAVENOUS | Status: DC | PRN
  Administered 2015-09-21: 4 mg via INTRAVENOUS

## 2015-09-21 MED ORDER — LACTATED RINGERS IR SOLN
Status: DC | PRN
  Administered 2015-09-21: 3000 mL

## 2015-09-21 MED ORDER — DEXAMETHASONE SODIUM PHOSPHATE 4 MG/ML IJ SOLN
INTRAMUSCULAR | Status: AC
  Filled 2015-09-21: qty 1

## 2015-09-21 MED ORDER — HYDROCODONE-ACETAMINOPHEN 5-325 MG PO TABS: ORAL_TABLET | ORAL | Status: DC

## 2015-09-21 MED ORDER — CITRIC ACID-SODIUM CITRATE 334-500 MG/5ML PO SOLN: 30.0000 mL | ORAL | Status: DC

## 2015-09-21 MED ORDER — FENTANYL CITRATE (PF) 100 MCG/2ML IJ SOLN
INTRAMUSCULAR | Status: DC | PRN
  Administered 2015-09-21: 150 ug via INTRAVENOUS
  Administered 2015-09-21 (×2): 100 ug via INTRAVENOUS

## 2015-09-21 MED ORDER — SUCCINYLCHOLINE CHLORIDE 20 MG/ML IJ SOLN
INTRAMUSCULAR | Status: DC | PRN
  Administered 2015-09-21: 100 mg via INTRAVENOUS

## 2015-09-21 MED ORDER — ROCURONIUM BROMIDE 100 MG/10ML IV SOLN
INTRAVENOUS | Status: AC
  Filled 2015-09-21: qty 1

## 2015-09-21 MED ORDER — ONDANSETRON HCL 4 MG/2ML IJ SOLN
INTRAMUSCULAR | Status: AC
Start: 2015-09-21 — End: 2015-09-21
  Filled 2015-09-21: qty 2

## 2015-09-21 MED ORDER — LIDOCAINE HCL (CARDIAC) 20 MG/ML IV SOLN
INTRAVENOUS | Status: DC | PRN
  Administered 2015-09-21: 60 mg via INTRAVENOUS

## 2015-09-21 MED ORDER — FENTANYL CITRATE (PF) 100 MCG/2ML IJ SOLN
INTRAMUSCULAR | Status: AC
Start: 2015-09-21 — End: 2015-09-21
  Filled 2015-09-21: qty 2

## 2015-09-21 MED ORDER — PROPOFOL 10 MG/ML IV BOLUS
INTRAVENOUS | Status: AC
  Filled 2015-09-21: qty 20

## 2015-09-21 SURGICAL SUPPLY — 28 items
APPLICATOR COTTON TIP 6IN STRL (MISCELLANEOUS) ×3 IMPLANT
BLADE SURG 15 STRL LF C SS BP (BLADE) ×1 IMPLANT
BLADE SURG 15 STRL SS (BLADE) ×2
CLOTH BEACON ORANGE TIMEOUT ST (SAFETY) ×3 IMPLANT
DURAPREP 26ML APPLICATOR (WOUND CARE) ×3 IMPLANT
FILTER SMOKE EVAC LAPAROSHD (FILTER) ×3 IMPLANT
GLOVE BIO SURGEON STRL SZ7 (GLOVE) ×3 IMPLANT
GLOVE BIOGEL PI IND STRL 7.0 (GLOVE) ×2 IMPLANT
GLOVE BIOGEL PI INDICATOR 7.0 (GLOVE) ×4
GOWN STRL REUS W/TWL LRG LVL3 (GOWN DISPOSABLE) ×6 IMPLANT
NEEDLE INSUFFLATION 120MM (ENDOMECHANICALS) IMPLANT
NS IRRIG 1000ML POUR BTL (IV SOLUTION) ×3 IMPLANT
PACK LAPAROSCOPY BASIN (CUSTOM PROCEDURE TRAY) ×3 IMPLANT
PENCIL BUTTON HOLSTER BLD 10FT (ELECTRODE) ×3 IMPLANT
POUCH SPECIMEN RETRIEVAL 10MM (ENDOMECHANICALS) ×3 IMPLANT
SET IRRIG TUBING LAPAROSCOPIC (IRRIGATION / IRRIGATOR) ×3 IMPLANT
SHEARS HARMONIC ACE PLUS 36CM (ENDOMECHANICALS) ×3 IMPLANT
SLEEVE XCEL OPT CAN 5 100 (ENDOMECHANICALS) ×9 IMPLANT
SUT VICRYL 0 ENDOLOOP (SUTURE) IMPLANT
SUT VICRYL 0 UR6 27IN ABS (SUTURE) ×3 IMPLANT
SUT VICRYL 4-0 PS2 18IN ABS (SUTURE) ×6 IMPLANT
TOWEL OR 17X24 6PK STRL BLUE (TOWEL DISPOSABLE) ×6 IMPLANT
TRAY FOLEY CATH SILVER 14FR (SET/KITS/TRAYS/PACK) ×3 IMPLANT
TROCAR BALLN 12MMX100 BLUNT (TROCAR) IMPLANT
TROCAR XCEL NON-BLD 11X100MML (ENDOMECHANICALS) ×3 IMPLANT
TROCAR XCEL NON-BLD 5MMX100MML (ENDOMECHANICALS) ×3 IMPLANT
WARMER LAPAROSCOPE (MISCELLANEOUS) ×3 IMPLANT
WATER STERILE IRR 1000ML POUR (IV SOLUTION) IMPLANT

## 2015-09-21 NOTE — Anesthesia Postprocedure Evaluation (Signed)
Anesthesia Post Note  Patient: Sarah Johns  Procedure(s) Performed: Procedure(s) (LRB): DIAGNOSTIC LAPAROSCOPY Salpingectomy with REMOVAL OF ECTOPIC PREGNANCY (N/A)  Patient location during evaluation: PACU Anesthesia Type: General Level of consciousness: awake and alert Pain management: pain level controlled Vital Signs Assessment: post-procedure vital signs reviewed and stable Respiratory status: spontaneous breathing, nonlabored ventilation, respiratory function stable and patient connected to nasal cannula oxygen Cardiovascular status: blood pressure returned to baseline and stable Postop Assessment: no signs of nausea or vomiting Anesthetic complications: no    Last Vitals:  Filed Vitals:   09/21/15 2255 09/21/15 2300  BP: 104/64   Pulse: 70 64  Temp: 37.2 C   Resp: 16 16    Last Pain:  Filed Vitals:   09/21/15 2306  PainSc: 4                  Sarah Johns

## 2015-09-21 NOTE — ED Notes (Signed)
Patient transported to and from ultrasound.

## 2015-09-21 NOTE — Discharge Instructions (Signed)

## 2015-09-21 NOTE — ED Notes (Signed)
[facility] at bedside for transport. 

## 2015-09-21 NOTE — MAU Note (Signed)
[facility] due to possible ectopic pregnancy.

## 2015-09-21 NOTE — ED Notes (Signed)
Abdominal pain for a week. Positive pregnancy test this am at home

## 2015-09-21 NOTE — Transfer of Care (Signed)
Immediate Anesthesia Transfer of Care Note  Patient: Art therapistAmber Lanphere  Procedure(s) Performed: Procedure(s): DIAGNOSTIC LAPAROSCOPY WITH REMOVAL OF ECTOPIC PREGNANCY (N/A)  Patient Location: PACU  Anesthesia Type:General  Level of Consciousness: awake and oriented  Airway & Oxygen Therapy: Patient Spontanous Breathing and Patient connected to nasal cannula oxygen  Post-op Assessment: Report given to RN and Post -op Vital signs reviewed and stable  Post vital signs: Reviewed and stable  Last Vitals:  Filed Vitals:   09/21/15 1757 09/21/15 1938  BP: 93/54 116/71  Pulse: 64 57  Temp: 36.9 C 36.9 C  Resp: 17 17    Complications: No apparent anesthesia complications

## 2015-09-21 NOTE — ED Notes (Signed)
[facility]

## 2015-09-21 NOTE — Anesthesia Procedure Notes (Signed)
Procedure Name: Intubation Date/Time: 09/21/2015 8:38 PM Performed by: Janeece AgeeWRAPE, Asencion Guisinger W Pre-anesthesia Checklist: Patient identified, Emergency Drugs available, Suction available, Patient being monitored and Timeout performed Patient Re-evaluated:Patient Re-evaluated prior to inductionOxygen Delivery Method: Circle system utilized Preoxygenation: Pre-oxygenation with 100% oxygen Intubation Type: IV induction, Rapid sequence and Cricoid Pressure applied Grade View: Grade II Tube type: Oral Tube size: 7.0 mm Number of attempts: 1 Airway Equipment and Method: Stylet Placement Confirmation: ETT inserted through vocal cords under direct vision,  positive ETCO2 and breath sounds checked- equal and bilateral Secured at: 21 cm Tube secured with: Tape Dental Injury: Teeth and Oropharynx as per pre-operative assessment

## 2015-09-21 NOTE — ED Notes (Signed)
Transport has arrived to [facility]. Patient is A&Ox4 and in NAD. Patient is tearful at transport due to emotional reasons, patient reassured. patient reports 7/10 pain at this time. Patient denies any needs at discharge.

## 2015-09-21 NOTE — MAU Provider Note (Signed)
History   G2P0010 early pregnant transferred from [place] for eval for suspected ectopic pregnancy. Pt alert and responsive, states pain is 9/10.   Pt had pregnancy [facility] Jan 31st with low beta hcg.  She thought she miscarried Feb 12th.  She then remembers a nml menses in March.  However, she has been going to Dr. Shawn[facility] [place] in [facility] for 3-4 weeks getting her beta chg levels drawn and was told they are "too low".  She has not had an US since Jan 2017 (see imaging tab).  Questionable if this is the same pregnancy from January.  The mass is large (4.5 cm) which would be too big for [redacted] week gestation.  CSN: [id number]  Arrival date & time 09/21/15  1305   None     Chief Complaint  Patient presents with  . Abdominal Pain    HPI  Past Medical History  Diagnosis Date  . Gastritis   . Fever blister   . Herpes     Past Surgical History  Procedure Laterality Date  . Dilation and curettage of uterus    . Induced abortion      No family history on file.  Social History  Substance Use Topics  . Smoking status: Current Every Day Smoker  . Smokeless tobacco: None  . Alcohol Use: Yes    OB History    Gravida Para Term Preterm AB TAB SAB Ectopic Multiple Living   1               Review of Systems  Constitutional: Negative.   HENT: Negative.   Eyes: Negative.   Respiratory: Negative.   Cardiovascular: Negative.   Gastrointestinal: Positive for abdominal pain.  Endocrine: Negative.   Genitourinary: Negative.   Musculoskeletal: Negative.   Skin: Negative.   Allergic/Immunologic: Negative.   Neurological: Negative.   Hematological: Negative.   Psychiatric/Behavioral: Negative.     Allergies  Cherry  Home Medications  No current outpatient prescriptions on file.  BP 93/54 mmHg  Pulse 64  Temp(Src) 98.5 F (36.9 C) (Oral)  Resp 17  Ht 5' 4.5" (1.638 m)  Wt 136 lb (61.689 kg)  BMI 22.99 kg/m2  SpO2 100%  LMP 08/27/2015  Physical Exam   Constitutional: She is oriented to person, place, and time. She appears well-developed and well-nourished.  HENT:  Head: Normocephalic.  Eyes: Pupils are equal, round, and reactive to light.  Neck: Normal range of motion.  Cardiovascular: Normal rate, regular rhythm, normal heart sounds and intact distal pulses.   Pulmonary/Chest: Effort normal and breath sounds normal.  Abdominal: Soft. There is tenderness.  Musculoskeletal: Normal range of motion.  Neurological: She is alert and oriented to person, place, and time. She has normal reflexes.  Skin: Skin is warm and dry.  Psychiatric: She has a normal mood and affect. Her behavior is normal. Judgment and thought content normal.    MAU Course  Procedures (including critical care time)  Labs Reviewed  URINALYSIS, ROUTINE W REFLEX MICROSCOPIC (NOT AT [facility]) - Abnormal; Notable for the following:    Leukocytes, UA TRACE (*)    All other components within normal limits  PREGNANCY, URINE - Abnormal; Notable for the following:    Preg Test, Ur POSITIVE (*)    All other components within normal limits  URINE MICROSCOPIC-ADD ON - Abnormal; Notable for the following:    Squamous Epithelial / LPF 0-5 (*)    Bacteria, UA RARE (*)    All  other components within normal limits  HCG, QUANTITATIVE, PREGNANCY - Abnormal; Notable for the following:    hCG, Beta Chain, Quant, S 637 (*)    All other components within normal limits  COMPREHENSIVE METABOLIC PANEL - Abnormal; Notable for the following:    Anion gap 4 (*)    All other components within normal limits  CBC WITH DIFFERENTIAL/PLATELET - Abnormal; Notable for the following:    RBC 3.72 (*)    Hemoglobin 11.0 (*)    HCT 32.5 (*)    RDW 15.6 (*)    All other components within normal limits   US Ob Comp Less 14 Wks  09/21/2015  CLINICAL DATA:  Positive pregnancy test.  Pelvic pain for 3 days. EXAM: OBSTETRIC <14 WK [place] AND TRANSVAGINAL OB US TECHNIQUE: Both transabdominal and transvaginal  ultrasound examinations were performed for complete evaluation of the gestation as well as the maternal uterus, adnexal regions, and pelvic cul-de-sac. Transvaginal technique was performed to assess early pregnancy. COMPARISON:  07/14/2015. FINDINGS: Intrauterine gestational sac: None Yolk sac:  None Embryo:  None Cardiac Activity: Heart Rate:   bpm MSD:   mm    w     d CRL:    mm    w    d                  [place] EDC: Subchorionic hemorrhage:  None visualized. Maternal uterus/adnexae: Within the right adnexa, there is a complex 4.8 x 3.4 x 3.2 cm soft tissue masslike area noted. Some internal blood flow noted on color imaging. I cannot exclude ectopic pregnancy in this area. This appears to be separate from the right ovary and was not definitively seen on prior ultrasound. No free fluid noted. IMPRESSION: Complex soft tissue area within the right adnexa with some internal color blood flow. Cannot exclude ectopic pregnancy. Recommend OB consultation and close interval follow-up. Critical Value/emergent results were called by telephone at the time of interpretation on 09/21/2015 at 4:15 pm to the med center ER Dr., who verbally acknowledged these results. Electronically Signed   By: Charlett Nose M.D.   On: 09/21/2015 16:16   US Ob Transvaginal  09/21/2015  CLINICAL DATA:  Positive pregnancy test.  Pelvic pain for 3 days. EXAM: OBSTETRIC <14 WK [place] AND TRANSVAGINAL OB US TECHNIQUE: Both transabdominal and transvaginal ultrasound examinations were performed for complete evaluation of the gestation as well as the maternal uterus, adnexal regions, and pelvic cul-de-sac. Transvaginal technique was performed to assess early pregnancy. COMPARISON:  07/14/2015. FINDINGS: Intrauterine gestational sac: None Yolk sac:  None Embryo:  None Cardiac Activity: Heart Rate:   bpm MSD:   mm    w     d CRL:    mm    w    d                  [place] EDC: Subchorionic hemorrhage:  None visualized. Maternal uterus/adnexae: Within the right adnexa,  there is a complex 4.8 x 3.4 x 3.2 cm soft tissue masslike area noted. Some internal blood flow noted on color imaging. I cannot exclude ectopic pregnancy in this area. This appears to be separate from the right ovary and was not definitively seen on prior ultrasound. No free fluid noted. IMPRESSION: Complex soft tissue area within the right adnexa with some internal color blood flow. Cannot exclude ectopic pregnancy. Recommend OB consultation and close interval follow-up. Critical Value/emergent results were called by telephone at the time of interpretation  on 09/21/2015 at 4:15 pm to the med center ER Dr., who verbally acknowledged these results. Electronically Signed   By: Charlett Nose M.D.   On: 09/21/2015 16:16     1. Abdominal pain in pregnancy       MDM  Right adenexal mass. Dr. Penne Lash to evaluate. Pain management  Given patient's pain and adnexal mass, will proceed with diagnostic laparoscopy and possible right salpingectomy.    Pt seen and examined.  Pt consented for Laparoscopy, possible right salpingectomy, and removal of ectopic pregnancy. Risks include but not limited to bleeding, infection, damage to intrabdominal organs (bowel, bladder, uterus ovaries, vasculature), complications from anesthesia, DVT, and risk for laparotomy.

## 2015-09-21 NOTE — ED Provider Notes (Signed)
CSN: [id number]     Arrival date & time 09/21/15  1305 History   First MD Initiated Contact with Patient 09/21/15 1426     Chief Complaint  Patient presents with  . Abdominal Pain     (Consider location/radiation/quality/duration/timing/severity/associated sxs/prior Treatment) Patient is a 25 y.o. female presenting with abdominal pain. The history is provided by the patient and medical records. No language interpreter was used.  Abdominal Pain Associated symptoms: nausea   Associated symptoms: no chills, no cough, no dysuria, no fever, no shortness of breath and no sore throat    Neita Ninetta LightsMacDonald is a 25 y.o. female  who presents to the Emergency Department complaining of bilateral crampy lower abdominal pain intermittently right greater than left over the last week. Patient had a miscarriage in mid February 2017. She states that she bled for approximately one week after, then resumed sexual activity as usual. Patient states that she spotted in mid March but is unsure if this was her menstrual cycle or not as it was not as heavy as usual. There were no clots. She took a pregnancy test this morning which was positive.  Admits to associated nausea. Denies vaginal discharge, dysuria, back pain.   Past Medical History  Diagnosis Date  . Gastritis   . Fever blister   . Herpes    Past Surgical History  Procedure Laterality Date  . Dilation and curettage of uterus    . Induced abortion     No family history on file. Social History  Substance Use Topics  . Smoking status: Current Every Day Smoker  . Smokeless tobacco: None  . Alcohol Use: Yes   OB History    No data available     Review of Systems  Constitutional: Negative for fever and chills.  HENT: Negative for congestion and sore throat.   Eyes: Negative for visual disturbance.  Respiratory: Negative for cough and shortness of breath.   Cardiovascular: Negative.   Gastrointestinal: Positive for nausea and abdominal pain.   Genitourinary: Negative for dysuria.  Musculoskeletal: Negative for back pain and neck pain.  Skin: Negative for rash.  Neurological: Negative for dizziness and headaches.      Allergies  Cherry  Home Medications   Prior to Admission medications   Medication Sig Start Date End Date Taking? Authorizing Provider  HYDROcodone-acetaminophen (NORCO/VICODIN) 5-325 MG tablet Take 1-2 tablets by mouth every 6 (six) hours as needed. 05/14/15   Gwyneth SproutWhitney Plunkett, MD  ibuprofen (ADVIL,MOTRIN) 600 MG tablet Take 1 tablet (600 mg total) by mouth every 8 (eight) hours as needed for pain. 12/10/12   Azalia BilisKevin Campos, MD  valACYclovir (VALTREX) 1000 MG tablet Take 1,000 mg by mouth daily.     Historical Provider, MD   BP 95/59 mmHg  Pulse 58  Temp(Src) 98 F (36.7 C) (Oral)  Resp 16  Ht 5' 4.5" (1.638 m)  Wt 61.689 kg  BMI 22.99 kg/m2  SpO2 100%  LMP 08/27/2015 Physical Exam  Constitutional: She is oriented to person, place, and time. She appears well-developed and well-nourished.  Alert and in no acute distress  HENT:  Head: Normocephalic and atraumatic.  Cardiovascular: Normal rate, regular rhythm and normal heart sounds.  Exam reveals no gallop and no friction rub.   No murmur heard. Pulmonary/Chest: Effort normal and breath sounds normal. No respiratory distress. She has no wheezes. She has no rales. She exhibits no tenderness.  Abdominal: Soft. Bowel sounds are normal. She exhibits no distension and no mass. There is tenderness (  Bilateral lower abdominal tenderness). There is no rebound and no guarding.  Musculoskeletal: Normal range of motion.  Neurological: She is alert and oriented to person, place, and time.  Skin: Skin is warm and dry.  Nursing note and vitals reviewed.   ED Course  Procedures (including critical care time)  CRITICAL CARE Performed by: Chase Picket Emma Birchler   Total critical care time: 35 minutes  Critical care time was exclusive of separately billable  procedures and treating other patients.  Critical care was necessary to treat or prevent imminent or life-threatening deterioration.  Critical care was time spent personally by me on the following activities: development of treatment plan with patient and/or surrogate as well as nursing, discussions with consultants, evaluation of patient's response to treatment, examination of patient, obtaining history from patient or surrogate, ordering and performing treatments and interventions, ordering and review of laboratory studies, ordering and review of radiographic studies, pulse oximetry and re-evaluation of patient's condition.  Labs Review Labs Reviewed  URINALYSIS, ROUTINE W REFLEX MICROSCOPIC (NOT AT [facility]) - Abnormal; Notable for the following:    Leukocytes, UA TRACE (*)    All other components within normal limits  PREGNANCY, URINE - Abnormal; Notable for the following:    Preg Test, Ur POSITIVE (*)    All other components within normal limits  URINE MICROSCOPIC-ADD ON - Abnormal; Notable for the following:    Squamous Epithelial / LPF 0-5 (*)    Bacteria, UA RARE (*)    All other components within normal limits  HCG, QUANTITATIVE, PREGNANCY - Abnormal; Notable for the following:    hCG, Beta Chain, Quant, S 637 (*)    All other components within normal limits  COMPREHENSIVE METABOLIC PANEL  CBC WITH DIFFERENTIAL/PLATELET    Imaging Review US Ob Comp Less 14 Wks  09/21/2015  CLINICAL DATA:  Positive pregnancy test.  Pelvic pain for 3 days. EXAM: OBSTETRIC <14 WK [place] AND TRANSVAGINAL OB US TECHNIQUE: Both transabdominal and transvaginal ultrasound examinations were performed for complete evaluation of the gestation as well as the maternal uterus, adnexal regions, and pelvic cul-de-sac. Transvaginal technique was performed to assess early pregnancy. COMPARISON:  07/14/2015. FINDINGS: Intrauterine gestational sac: None Yolk sac:  None Embryo:  None Cardiac Activity: Heart Rate:   bpm MSD:    mm    w     d CRL:    mm    w    d                  [place] EDC: Subchorionic hemorrhage:  None visualized. Maternal uterus/adnexae: Within the right adnexa, there is a complex 4.8 x 3.4 x 3.2 cm soft tissue masslike area noted. Some internal blood flow noted on color imaging. I cannot exclude ectopic pregnancy in this area. This appears to be separate from the right ovary and was not definitively seen on prior ultrasound. No free fluid noted. IMPRESSION: Complex soft tissue area within the right adnexa with some internal color blood flow. Cannot exclude ectopic pregnancy. Recommend OB consultation and close interval follow-up. Critical Value/emergent results were called by telephone at the time of interpretation on 09/21/2015 at 4:15 pm to the med center ER Dr., who verbally acknowledged these results. Electronically Signed   By: Charlett Nose M.D.   On: 09/21/2015 16:16   US Ob Transvaginal  09/21/2015  CLINICAL DATA:  Positive pregnancy test.  Pelvic pain for 3 days. EXAM: OBSTETRIC <14 WK [place] AND TRANSVAGINAL OB US TECHNIQUE: Both transabdominal and transvaginal  ultrasound examinations were performed for complete evaluation of the gestation as well as the maternal uterus, adnexal regions, and pelvic cul-de-sac. Transvaginal technique was performed to assess early pregnancy. COMPARISON:  07/14/2015. FINDINGS: Intrauterine gestational sac: None Yolk sac:  None Embryo:  None Cardiac Activity: Heart Rate:   bpm MSD:   mm    w     d CRL:    mm    w    d                  [place] EDC: Subchorionic hemorrhage:  None visualized. Maternal uterus/adnexae: Within the right adnexa, there is a complex 4.8 x 3.4 x 3.2 cm soft tissue masslike area noted. Some internal blood flow noted on color imaging. I cannot exclude ectopic pregnancy in this area. This appears to be separate from the right ovary and was not definitively seen on prior ultrasound. No free fluid noted. IMPRESSION: Complex soft tissue area within the right adnexa with  some internal color blood flow. Cannot exclude ectopic pregnancy. Recommend OB consultation and close interval follow-up. Critical Value/emergent results were called by telephone at the time of interpretation on 09/21/2015 at 4:15 pm to the med center ER Dr., who verbally acknowledged these results. Electronically Signed   By: Charlett Nose M.D.   On: 09/21/2015 16:16   I have personally reviewed and evaluated these images and lab results as part of my medical decision-making.   EKG Interpretation None      MDM   Final diagnoses:  Abdominal pain in pregnancy   Eunice Francom presents to ED for + home pregnancy test and bilateral lower abdominal cramping. Ultrasound was obtained which shows a complex soft tissue area within the right adnexa which cannot exclude ectopic pregnancy. HCG of 637. OB/GYN, Dr. Adrian Blackwater, was consulted. Patient to be sent to [facility] for further evaluation and management. Will obtain basic labs prior to transfer. Patient was made aware of condition and disposition. All questions were answered.  Chase Picket Anayia Eugene, PA-C 09/21/15 1704  Chase Picket Citlaly Camplin, PA-C 09/21/15 1705  Leta Baptist, MD 09/23/15 2001

## 2015-09-21 NOTE — Anesthesia Preprocedure Evaluation (Signed)
Anesthesia Evaluation  Patient identified by MRN, date of birth, ID band Patient awake    Reviewed: Allergy & Precautions, NPO status , Patient's Chart, lab work & pertinent test results  History of Anesthesia Complications Negative for: history of anesthetic complications  Airway Mallampati: II  TM Distance: >3 FB Neck ROM: Full    Dental no notable dental hx. (+) Dental Advisory Given   Pulmonary former smoker,    Pulmonary exam normal breath sounds clear to auscultation       Cardiovascular negative cardio ROS Normal cardiovascular exam Rhythm:Regular Rate:Normal     Neuro/Psych negative neurological ROS  negative psych ROS   GI/Hepatic negative GI ROS, Neg liver ROS,   Endo/Other  negative endocrine ROS  Renal/GU negative Renal ROS  negative genitourinary   Musculoskeletal negative musculoskeletal ROS (+)   Abdominal   Peds negative pediatric ROS (+)  Hematology negative hematology ROS (+)   Anesthesia Other Findings   Reproductive/Obstetrics (+) Pregnancy Ectopic 4 weeks                             Anesthesia Physical Anesthesia Plan  ASA: II and emergent  Anesthesia Plan: General   Post-op Pain Management:    Induction: Intravenous, Rapid sequence and Cricoid pressure planned  Airway Management Planned: Oral ETT  Additional Equipment:   Intra-op Plan:   Post-operative Plan: Extubation in OR  Informed Consent: I have reviewed the patients History and Physical, chart, labs and discussed the procedure including the risks, benefits and alternatives for the proposed anesthesia with the patient or authorized representative who has indicated his/her understanding and acceptance.   Dental advisory given  Plan Discussed with: CRNA  Anesthesia Plan Comments:         Anesthesia Quick Evaluation

## 2015-09-21 NOTE — ED Notes (Signed)
Patient transported to US 

## 2015-09-22 LAB — ABO/RH: ABO/RH(D): O POS

## 2015-09-22 NOTE — Addendum Note (Signed)
Addendum  created 09/22/15 2254 by Janeece Ageeynthia W Jahmez Bily, CRNA   Modules edited: Anesthesia Medication Administration

## 2015-09-23 ENCOUNTER — Encounter (HOSPITAL_COMMUNITY): Payer: Self-pay | Admitting: Obstetrics & Gynecology

## 2015-09-30 ENCOUNTER — Encounter: Payer: Medicaid Other | Admitting: Obstetrics & Gynecology

## 2015-09-30 NOTE — Op Note (Signed)
Sarah Johns PROCEDURE DATE: 09/21/2015  PREOPERATIVE DIAGNOSIS: Right ectopic pregnancy  POSTOPERATIVE DIAGNOSIS: Right fallopian tube ectopic pregnancy  PROCEDURE: Laparoscopic right salpingectomy and removal of ectopic pregnancy  SURGEON:  Dr. Elsie LincolnKelly Hillarie Harrigan  ASSISTANT: Dr. Karyl KinnierKim Newton  ANESTHESIOLOGIST: Dr. Karie SchwalbeMary Judd  INDICATIONS: 25 y.o. G2P0010 at 2441w6d here for with large right ectopic pregnancy. On exam, she had stable vital signs. Patient was counseled regarding need for laparoscopic salpingectomy. Risks of surgery including bleeding which may require transfusion or reoperation, infection, injury to bowel or other surrounding organs, need for additional procedures including laparotomy and other postoperative/anesthesia complications were explained to patient.  Written informed consent was obtained.  FINDINGS:  Small amount of hemoperitoneum estimated to be about 50 of blood and clots.  Dilated right fallopian tube containing ectopic gestation. Small normal appearing uterus, normal left fallopian tube, right ovary and left ovary.  ANESTHESIA: General  ESTIMATED BLOOD LOSS: 150 ml  SPECIMENS: right fallopian tube containing ectopic gestation  COMPLICATIONS: None immediate  PROCEDURE IN DETAIL:  The patient was taken to the operating room where general anesthesia was administered and was found to be adequate.  She was placed in the dorsal lithotomy position, and was prepped and draped in a sterile manner.  A Foley catheter was inserted into her bladder and attached to constant drainage and a uterine manipulator was then advanced into the uterus .  After an adequate timeout was performed, attention was then turned to the patient's abdomen where a 10-mm skin incision was made on the umbilical fold.  Using the 10/11 [employer] Optiview trocar, the peritoneal cavity was entered directly with the laparoscope.  .  Adequate pneumoperitoneum was obtained. A survey of the patient's pelvis and  abdomen revealed the findings as above.  Bilateral 5-mm lower quadrant ports were placed under direct visualization  The Nezhat suction irrigator was then used to suction the hemoperitoneum and irrigate the pelvis.  A fourth 5 mm port was placed in the left side of the lower abdomen to aid in stabilization of the large ectopic.  Attention was then turned to the right fallopian tube which was grasped and ligated from the underlying mesosalpinx and uterine attachment using the Harmonic Scapel instrument.  Good hemostasis was noted.  The specimen was placed in an EndoCatch bag and removed from the abdomen intact.  The abdomen was desufflated, and all instruments were removed.  The fascial incisions of the umbilical site was reapproximated with 0 Vicryl frunning stich; and all skin incisions were closed with a 3-0 Vicryl subcuticular stitch.  Dermabond was placed over the skin incisions to aid in skin re approximation.   The tenaculum was removed from the patient's vagina.  The patient tolerated the procedures well.  All instruments, needles, and sponge counts were correct x 2. The patient was taken to the recovery room in stable condition.     Xzavien Harada H. 09/30/2015 8:24 AM

## 2015-10-02 ENCOUNTER — Ambulatory Visit (INDEPENDENT_AMBULATORY_CARE_PROVIDER_SITE_OTHER): Payer: Medicaid Other | Admitting: Obstetrics & Gynecology

## 2015-10-02 ENCOUNTER — Encounter: Payer: Self-pay | Admitting: Obstetrics & Gynecology

## 2015-10-02 VITALS — BP 97/59 | Temp 98.5°F | Wt 135.0 lb

## 2015-10-02 DIAGNOSIS — O009 Unspecified ectopic pregnancy without intrauterine pregnancy: Secondary | ICD-10-CM

## 2015-10-02 LAB — HCG, QUANTITATIVE, PREGNANCY: HCG, BETA CHAIN, QUANT, S: 3 m[IU]/mL

## 2015-10-02 NOTE — Patient Instructions (Signed)
Salpingectomy, Care After  Refer to this sheet in the next few weeks. These instructions provide you with information on caring for yourself after your procedure. Your health care provider may also give you more specific instructions. Your treatment has been planned according to current medical practices, but problems sometimes occur. Call your health care provider if you have any problems or questions after your procedure.  WHAT TO EXPECT AFTER THE PROCEDURE  After your procedure, it is typical to have the following:  · Abdominal pain that can be controlled with pain medicine.  · Vaginal spotting.  · Tiredness.  HOME CARE INSTRUCTIONS  · Get plenty of rest and sleep.  · Only take over-the-counter or prescription medicines as directed by your health care provider.  · Keep incision areas clean and dry. Remove or change any bandages (dressings) only as directed by your health care provider.  · You may resume your regular diet. Eat a well-balanced diet.  · Drink enough fluids to keep your urine clear or pale yellow.  · Limit exercise and activities as directed by your health care provider. Do not lift anything heavier than 5 lb (2.3 kg) until your health care provider approves.  · Do not drive until your health care provider approves.  · Do not have sexual intercourse until your health care provider says it is okay.  · Take your temperature twice a day for the first week. Write those temperatures down.  · Follow up with your health care provider as directed.  SEEK MEDICAL CARE IF:  · You have pain when you urinate.  · You see pus coming out of the incision, or the incision is separating.  · You have increasing abdominal pain.  · You have swelling or redness in the incision area.  · You develop a rash.  · You feel lightheaded.  · You have pain that is not controlled with medicine.  SEEK IMMEDIATE MEDICAL CARE IF:  · You develop a fever.  · You have increasing abdominal pain.  · You develop chest or leg pain.  · You  develop shortness of breath.  · You pass out.     This information is not intended to replace advice given to you by your health care provider. Make sure you discuss any questions you have with your health care provider.     Document Released: 09/03/2010 Document Revised: 06/20/2014 Document Reviewed: 11/21/2012  Elsevier Interactive Patient Education ©2016 [employer]

## 2015-10-02 NOTE — Progress Notes (Signed)
Patient ID: Sarah Johns, female   DOB: Jan 28, 1991, 25 y.o.   MRN: [id number] History:  25 y.o. G2P0010 here today for post op check.  Pt s/p laparoscopy for ectopic pregnancy 09/21/2015.   Pt s/p right salpingectomy.  She has since had 7 teeth pulled.  She denies abd pain.  No N/V  The following portions of the patient's history were reviewed and updated as appropriate: allergies, current medications, past family history, past medical history, past social history, past surgical history and problem list.  Review of Systems:  Pertinent items are noted in HPI.  Objective:  Physical Exam Blood pressure 97/59, temperature 98.5 F (36.9 C), weight 135 lb (61.236 kg), last menstrual period 08/27/2015. Gen: NAD Abd: Soft, nontender and nondistended; 5 port sites healing well.   Labs and Imaging [place] Ob Comp Less 14 Wks  09/21/2015  CLINICAL DATA:  Right lower quadrant pain for 3 weeks. EXAM: OBSTETRIC <14 WK US AND TRANSVAGINAL OB US TECHNIQUE: Both transabdominal and transvaginal ultrasound examinations were performed for complete evaluation of the gestation as well as the maternal uterus, adnexal regions, and pelvic cul-de-sac. Transvaginal technique was performed to assess early pregnancy. COMPARISON:  [facility]2016 FINDINGS: Intrauterine gestational sac: No Yolk sac:  No Embryo:  No Cardiac Activity: No Subchorionic hemorrhage:  None visualized. Maternal uterus/adnexae: There is a complex right adnexal mass immediately adjacent to the right ovary with marked increased profusion consistent with an ectopic pregnancy. The ovaries appear normal. IMPRESSION: Findings consistent with a right ectopic pregnancy. Critical Value/emergent results were called by telephone at the time of interpretation on 09/21/2015 at 7:27 [facility], who verbally acknowledged these results. Electronically Signed   By: Francene BoyersJames  Maxwell M.D.   On: 09/21/2015 19:27   [place] Ob  Comp Less 14 Wks  09/21/2015  CLINICAL DATA:  Positive pregnancy test.  Pelvic pain for 3 days. EXAM: OBSTETRIC <14 WK US AND TRANSVAGINAL OB US TECHNIQUE: Both transabdominal and transvaginal ultrasound examinations were performed for complete evaluation of the gestation as well as the maternal uterus, adnexal regions, and pelvic cul-de-sac. Transvaginal technique was performed to assess early pregnancy. COMPARISON:  07/14/2015. FINDINGS: Intrauterine gestational sac: None Yolk sac:  None Embryo:  None Cardiac Activity: Heart Rate:   bpm MSD:   mm    w     d CRL:    mm    w    d                  US EDC: Subchorionic hemorrhage:  None visualized. Maternal uterus/adnexae: Within the right adnexa, there is a complex 4.8 x 3.4 x 3.2 cm soft tissue masslike area noted. Some internal blood flow noted on color imaging. I cannot exclude ectopic pregnancy in this area. This appears to be separate from the right ovary and was not definitively seen on prior ultrasound. No free fluid noted. IMPRESSION: Complex soft tissue area within the right adnexa with some internal color blood flow. Cannot exclude ectopic pregnancy. Recommend OB consultation and close interval follow-up. Critical Value/emergent results were called by telephone at the time of interpretation on 09/21/2015 at 4:15 pm to the med center ER Dr., who verbally acknowledged these results. Electronically Signed   By: Charlett NoseKevin  Dover M.D.   On: 09/21/2015 16:16   [place] Ob Transvaginal  09/21/2015  CLINICAL DATA:  Right lower quadrant pain for 3 weeks. EXAM: OBSTETRIC <14 WK US AND TRANSVAGINAL OB US TECHNIQUE: Both  transabdominal and transvaginal ultrasound examinations were performed for complete evaluation of the gestation as well as the maternal uterus, adnexal regions, and pelvic cul-de-sac. Transvaginal technique was performed to assess early pregnancy. COMPARISON:  Ultrasound performed at [facility] on 02/25/2016 FINDINGS: Intrauterine gestational sac:  No Yolk sac:  No Embryo:  No Cardiac Activity: No Subchorionic hemorrhage:  None visualized. Maternal uterus/adnexae: There is a complex right adnexal mass immediately adjacent to the right ovary with marked increased profusion consistent with an ectopic pregnancy. The ovaries appear normal. IMPRESSION: Findings consistent with a right ectopic pregnancy. Critical Value/emergent results were called by telephone at the time of interpretation on 09/21/2015 at 7:27 pm to [facility], Midwife, who verbally acknowledged these results. Electronically Signed   By: Francene Boyers M.D.   On: 09/21/2015 19:27   US Ob Transvaginal  09/21/2015  CLINICAL DATA:  Positive pregnancy test.  Pelvic pain for 3 days. EXAM: OBSTETRIC <14 WK [place] AND TRANSVAGINAL OB US TECHNIQUE: Both transabdominal and transvaginal ultrasound examinations were performed for complete evaluation of the gestation as well as the maternal uterus, adnexal regions, and pelvic cul-de-sac. Transvaginal technique was performed to assess early pregnancy. COMPARISON:  07/14/2015. FINDINGS: Intrauterine gestational sac: None Yolk sac:  None Embryo:  None Cardiac Activity: Heart Rate:   bpm MSD:   mm    w     d CRL:    mm    w    d                  [place] EDC: Subchorionic hemorrhage:  None visualized. Maternal uterus/adnexae: Within the right adnexa, there is a complex 4.8 x 3.4 x 3.2 cm soft tissue masslike area noted. Some internal blood flow noted on color imaging. I cannot exclude ectopic pregnancy in this area. This appears to be separate from the right ovary and was not definitively seen on prior ultrasound. No free fluid noted. IMPRESSION: Complex soft tissue area within the right adnexa with some internal color blood flow. Cannot exclude ectopic pregnancy. Recommend OB consultation and close interval follow-up. Critical Value/emergent results were called by telephone at the time of interpretation on 09/21/2015 at 4:15 pm to the med center ER Dr., who verbally  acknowledged these results. Electronically Signed   By: Charlett Nose M.D.   On: 09/21/2015 16:16    Assessment & Plan:  2 week post op check after right salpingectomy for ectopic pregnancy- doing well  qHCG done today F/u prn Return to full activity  Sarah Johns, M.D., Evern Core

## 2015-10-05 ENCOUNTER — Telehealth: Payer: Self-pay

## 2015-10-05 NOTE — Telephone Encounter (Signed)
Please call pt. She does not need further HCG's levels  I attempted to call patient but there was no answer or voicemail to leave a message.

## 2015-10-06 NOTE — Telephone Encounter (Signed)
I attempted to call back but there was no answer or voicemail to leave a message.

## 2015-10-07 NOTE — Telephone Encounter (Signed)
Attempted to call patient to give her lab results.

## 2015-10-08 ENCOUNTER — Encounter: Payer: Self-pay | Admitting: Obstetrics & Gynecology

## 2016-06-03 ENCOUNTER — Emergency Department (HOSPITAL_BASED_OUTPATIENT_CLINIC_OR_DEPARTMENT_OTHER)
Admission: EM | Admit: 2016-06-03 | Discharge: 2016-06-03 | Disposition: A | Discharge status: Home / Self Care | Payer: Medicaid Other | Attending: Emergency Medicine | Admitting: Emergency Medicine

## 2016-06-03 ENCOUNTER — Emergency Department (HOSPITAL_BASED_OUTPATIENT_CLINIC_OR_DEPARTMENT_OTHER): Payer: Medicaid Other

## 2016-06-03 ENCOUNTER — Encounter (HOSPITAL_BASED_OUTPATIENT_CLINIC_OR_DEPARTMENT_OTHER): Payer: Self-pay

## 2016-06-03 DIAGNOSIS — Z3A10 10 weeks gestation of pregnancy: Secondary | ICD-10-CM | POA: Diagnosis not present

## 2016-06-03 DIAGNOSIS — F17200 Nicotine dependence, unspecified, uncomplicated: Secondary | ICD-10-CM | POA: Diagnosis not present

## 2016-06-03 DIAGNOSIS — N898 Other specified noninflammatory disorders of vagina: Secondary | ICD-10-CM | POA: Diagnosis not present

## 2016-06-03 DIAGNOSIS — R1030 Lower abdominal pain, unspecified: Secondary | ICD-10-CM | POA: Diagnosis not present

## 2016-06-03 DIAGNOSIS — O99331 Smoking (tobacco) complicating pregnancy, first trimester: Secondary | ICD-10-CM | POA: Diagnosis not present

## 2016-06-03 DIAGNOSIS — O26891 Other specified pregnancy related conditions, first trimester: Secondary | ICD-10-CM | POA: Diagnosis not present

## 2016-06-03 DIAGNOSIS — O219 Vomiting of pregnancy, unspecified: Secondary | ICD-10-CM | POA: Diagnosis present

## 2016-06-03 DIAGNOSIS — R109 Unspecified abdominal pain: Secondary | ICD-10-CM

## 2016-06-03 LAB — URINALYSIS, MICROSCOPIC (REFLEX)

## 2016-06-03 LAB — CBC WITH DIFFERENTIAL/PLATELET
BASOS ABS: 0 10*3/uL (ref 0.0–0.1)
BASOS PCT: 0 %
EOS ABS: 0.1 10*3/uL (ref 0.0–0.7)
EOS PCT: 1 %
HEMATOCRIT: 33 % — AB (ref 36.0–46.0)
Hemoglobin: 11.3 g/dL — ABNORMAL LOW (ref 12.0–15.0)
Lymphocytes Relative: 34 %
Lymphs Abs: 3.2 10*3/uL (ref 0.7–4.0)
MCH: 29.7 pg (ref 26.0–34.0)
MCHC: 34.2 g/dL (ref 30.0–36.0)
MCV: 86.8 fL (ref 78.0–100.0)
MONO ABS: 1 10*3/uL (ref 0.1–1.0)
Monocytes Relative: 10 %
NEUTROS ABS: 5.1 10*3/uL (ref 1.7–7.7)
Neutrophils Relative %: 55 %
PLATELETS: 238 10*3/uL (ref 150–400)
RBC: 3.8 MIL/uL — ABNORMAL LOW (ref 3.87–5.11)
RDW: 13.4 % (ref 11.5–15.5)
WBC: 9.4 10*3/uL (ref 4.0–10.5)

## 2016-06-03 LAB — COMPREHENSIVE METABOLIC PANEL
ALBUMIN: 4 g/dL (ref 3.5–5.0)
ALT: 14 U/L (ref 14–54)
AST: 26 U/L (ref 15–41)
Alkaline Phosphatase: 59 U/L (ref 38–126)
Anion gap: 7 (ref 5–15)
BUN: 6 mg/dL (ref 6–20)
CHLORIDE: 105 mmol/L (ref 101–111)
CO2: 23 mmol/L (ref 22–32)
Calcium: 9 mg/dL (ref 8.9–10.3)
Creatinine, Ser: 0.61 mg/dL (ref 0.44–1.00)
GFR calc Af Amer: >60 mL/min (ref >60)
GFR calc non Af Amer: >60 mL/min (ref >60)
GLUCOSE: 92 mg/dL (ref 65–99)
POTASSIUM: 3.6 mmol/L (ref 3.5–5.1)
SODIUM: 135 mmol/L (ref 135–145)
Total Bilirubin: 0.5 mg/dL (ref 0.3–1.2)
Total Protein: 7.2 g/dL (ref 6.5–8.1)

## 2016-06-03 LAB — PREGNANCY, URINE: Preg Test, Ur: POSITIVE — AB

## 2016-06-03 LAB — HCG, QUANTITATIVE, PREGNANCY: hCG, Beta Chain, Quant, S: 69 m[IU]/mL — ABNORMAL HIGH (ref <5)

## 2016-06-03 LAB — URINALYSIS, ROUTINE W REFLEX MICROSCOPIC
Bilirubin Urine: NEGATIVE
Glucose, UA: NEGATIVE mg/dL
HGB URINE DIPSTICK: NEGATIVE
Ketones, ur: NEGATIVE mg/dL
Nitrite: NEGATIVE
Protein, ur: NEGATIVE mg/dL
SPECIFIC GRAVITY, URINE: 1.018 (ref 1.005–1.030)
pH: 6 (ref 5.0–8.0)

## 2016-06-03 LAB — WET PREP, GENITAL
SPERM: NONE SEEN
TRICH WET PREP: NONE SEEN
Yeast Wet Prep HPF POC: NONE SEEN

## 2016-06-03 MED ORDER — SODIUM CHLORIDE 0.9 % IV BOLUS (SEPSIS)
1000.0000 mL | Freq: Once | INTRAVENOUS | Status: AC
  Administered 2016-06-03: 1000 mL via INTRAVENOUS

## 2016-06-03 MED ORDER — ONDANSETRON HCL 4 MG/2ML IJ SOLN
4.0000 mg | Freq: Once | INTRAMUSCULAR | Status: AC
  Administered 2016-06-03: 4 mg via INTRAVENOUS
  Filled 2016-06-03: qty 2

## 2016-06-03 NOTE — ED Provider Notes (Signed)
Ultrasound no iup.   [facility].  Pt counseled on risk of ectopic and need for followup.  [facility]   Lonia SkinnerLeslie K HallamSofia, PA-C 06/03/16 1750    Pricilla LovelessScott Goldston, MD 06/04/16 [phone]

## 2016-06-03 NOTE — ED Triage Notes (Signed)
C/o n/v x 3-4 days-diarrhea x 1 days-NAD-steady gait

## 2016-06-03 NOTE — ED Provider Notes (Signed)
[facility] Provider [facility]   CSN: [id number] Arrival date & time: 06/03/16  1223     History   Chief Complaint Chief Complaint  Patient presents with  . Emesis    HPI Sarah Johns is a 25 y.o. female.  HPI   Patient is a 25 year old female with history of ectopic pregnancy and removal of left fallopian tube who presents the ED with complaint of nausea and vomiting, onset one week. Patient reports over the past week she has had worsening nausea and vomiting and she has been unable to keep any fluids down for the past 3 days. She also reports having associated mild intermittent lower abdominal cramping. Pt also reports small amount of white vaginal d/c consistent with her usual discharge. Denies any aggravating or alleviating factors. Denies fever, chills, SOB, CP, hematemesis, diarrhea, constipation, urinary symptoms, vaginal bleeding. G2P0, reports hx of 2 ectopic pregnancies. LMP 03/22/16. Patient reports she is sexually active with one partner and denies using condoms.  Past Medical History:  Diagnosis Date  . Fever blister   . Gastritis   . Herpes     There are no active problems to display for this patient.   Past Surgical History:  Procedure Laterality Date  . DIAGNOSTIC LAPAROSCOPY WITH REMOVAL OF ECTOPIC PREGNANCY N/A 09/21/2015   Procedure: DIAGNOSTIC LAPAROSCOPY Salpingectomy with REMOVAL OF ECTOPIC PREGNANCY;  Surgeon: Lesly DukesKelly H Leggett, MD;  Location: [facility];  Service: Gynecology;  Laterality: N/A;    OB History    Gravida Para Term Preterm AB Living   2       1 0   SAB TAB Ectopic Multiple Live Births   1 0             Home Medications    Prior to Admission medications   Not on File    Family History Family History  Problem Relation Age of Onset  . Diabetes Mother   . Hypertension Mother   . Asthma Mother   . Cancer Father   . Cancer Paternal Grandmother     Social History Social History  Substance Use Topics  . Smoking  status: Current Every Day Smoker  . Smokeless tobacco: Never Used  . Alcohol use No     Allergies   Cherry   Review of Systems Review of Systems  Gastrointestinal: Positive for abdominal pain, nausea and vomiting.  Genitourinary: Positive for vaginal discharge.  All other systems reviewed and are negative.    Physical Exam Updated Vital Signs BP 128/85 (BP Location: Left Arm)   Pulse 94   Temp 98.3 F (36.8 C) (Oral)   Resp 18   Ht 5\' 5"  (1.651 m)   Wt 62.6 kg   LMP 03/24/2016   SpO2 100%   Breastfeeding? Unknown   BMI 22.96 kg/m   Physical Exam  Constitutional: She is oriented to person, place, and time. She appears well-developed and well-nourished.  HENT:  Head: Normocephalic and atraumatic.  Mouth/Throat: Uvula is midline, oropharynx is clear and moist and mucous membranes are normal. No oropharyngeal exudate, posterior oropharyngeal edema, posterior oropharyngeal erythema or tonsillar abscesses. No tonsillar exudate.  Eyes: Conjunctivae and EOM are normal. Right eye exhibits no discharge. Left eye exhibits no discharge. No scleral icterus.  Neck: Normal range of motion. Neck supple.  Cardiovascular: Normal rate, regular rhythm, normal heart sounds and intact distal pulses.   Pulmonary/Chest: Effort normal and breath sounds normal. No respiratory distress. She has no wheezes. She has no rales. She exhibits  no tenderness.  Abdominal: Soft. Bowel sounds are normal. She exhibits no distension and no mass. There is tenderness (mild TTP over suprapubic region). There is no rebound and no guarding. No hernia.  Musculoskeletal: She exhibits no edema.  Neurological: She is alert and oriented to person, place, and time.  Skin: Skin is warm and dry.  Nursing note and vitals reviewed.  Pelvic exam: normal external genitalia, vulva, vagina, cervix, uterus and adnexa, VULVA: normal appearing vulva with no masses, tenderness or lesions, VAGINA: normal appearing vagina with  normal color and discharge, no lesions, vaginal discharge - white/yellow mucoid, CERVIX: normal appearing cervix without discharge or lesions, WET MOUNT done - results: PENDING AT HAND-OFF, DNA probe for chlamydia and GC obtained, closed cervical os, UTERUS: uterus is normal size, shape, consistency and nontender, ADNEXA: normal adnexa in size, nontender and no masses, exam chaperoned by female nurse.   ED Treatments / Results  Labs (all labs ordered are listed, but only abnormal results are displayed) Labs Reviewed  PREGNANCY, URINE - Abnormal; Notable for the following:       Result Value   Preg Test, Ur POSITIVE (*)    All other components within normal limits  URINALYSIS, ROUTINE W REFLEX MICROSCOPIC - Abnormal; Notable for the following:    APPearance CLOUDY (*)    Leukocytes, UA SMALL (*)    All other components within normal limits  URINALYSIS, MICROSCOPIC (REFLEX) - Abnormal; Notable for the following:    Bacteria, UA MANY (*)    Squamous Epithelial / LPF 0-5 (*)    All other components within normal limits  CBC WITH DIFFERENTIAL/PLATELET - Abnormal; Notable for the following:    RBC 3.80 (*)    Hemoglobin 11.3 (*)    HCT 33.0 (*)    All other components within normal limits  WET PREP, GENITAL  COMPREHENSIVE METABOLIC PANEL  HCG, QUANTITATIVE, PREGNANCY  RPR  HIV ANTIBODY (ROUTINE TESTING)  GC/CHLAMYDIA PROBE AMP ([place]) NOT [facility]    EKG  EKG Interpretation None       Radiology No results found.  Procedures Procedures (including critical care time)  Medications Ordered in ED Medications  sodium chloride 0.9 % bolus 1,000 mL (1,000 mLs Intravenous New Bag/Given 06/03/16 1423)  ondansetron (ZOFRAN) injection 4 mg (4 mg Intravenous Given 06/03/16 1431)     Initial Impression / Assessment and Plan / ED Course  I have reviewed the triage vital signs and the nursing notes.  Pertinent labs & imaging results that were available during my care of the  patient were reviewed by me and considered in my medical decision making (see chart for details).  Clinical Course     Patient presents with nausea, vomiting and intermittent lower abdominal cramping for the past week. Also reports having mild vaginal discharges fish consistent with her usual discharge. VSS. Exam revealed mild tenderness over suprapubic region, no peritoneal signs. Pelvic exam revealed small amount of white/yellow mucoid discharge, closed cervical os, no CMT or adnexal tenderness. Patient given IV fluids and Zofran.  Pregnancy positive. HCG 69. Patient reports history of ectopic pregnancies with her last one resulting in removal of her left fallopian tube. Wet prep pending. Swabs obtained for GC/chlamydia, HIV and RPR, pending. Due to patient with history of multiple ectopic pregnancies and recently diagnosed pregnancy today, will order a pelvic ultrasound for further evaluation.   Hand-off to NCR CorporationKaren Sofia, PA-C. Pending US and wet prep. Skimming ultrasound is unremarkable, plan to have patient follow up with  OB/GYN 48 hours for repeat beta hCG, PO challenge pt and discharge home with symptomatic treatment.  Final Clinical Impressions(s) / ED Diagnoses   Final diagnoses:  None    New Prescriptions New Prescriptions   No medications on file     Barrett Henle, PA-C 06/03/16 1556    Charlynne Pander, MD 06/04/16 0900

## 2016-06-03 NOTE — ED Notes (Signed)
Patient awaiting ultrasound

## 2016-06-03 NOTE — ED Notes (Signed)
Pt reports she has been having nausea since Saturday. Smells and "grease" make it worse. States "We walked into [employer] last night and I had to walk back out"

## 2016-06-04 LAB — RPR: RPR: NONREACTIVE

## 2016-06-04 LAB — HIV ANTIBODY (ROUTINE TESTING W REFLEX): HIV Screen 4th Generation wRfx: NONREACTIVE

## 2016-06-05 ENCOUNTER — Inpatient Hospital Stay (HOSPITAL_COMMUNITY)
Admission: AD | Admit: 2016-06-05 | Discharge: 2016-06-05 | Disposition: A | Discharge status: Home / Self Care | Payer: Medicaid Other | Source: Ambulatory Visit | Attending: Obstetrics and Gynecology | Admitting: Obstetrics and Gynecology

## 2016-06-05 ENCOUNTER — Encounter (HOSPITAL_COMMUNITY): Payer: Self-pay | Admitting: *Deleted

## 2016-06-05 ENCOUNTER — Inpatient Hospital Stay (HOSPITAL_COMMUNITY): Payer: Medicaid Other

## 2016-06-05 DIAGNOSIS — R51 Headache: Secondary | ICD-10-CM | POA: Diagnosis not present

## 2016-06-05 DIAGNOSIS — O209 Hemorrhage in early pregnancy, unspecified: Secondary | ICD-10-CM | POA: Insufficient documentation

## 2016-06-05 DIAGNOSIS — O3680X0 Pregnancy with inconclusive fetal viability, not applicable or unspecified: Secondary | ICD-10-CM

## 2016-06-05 DIAGNOSIS — O26891 Other specified pregnancy related conditions, first trimester: Secondary | ICD-10-CM | POA: Insufficient documentation

## 2016-06-05 DIAGNOSIS — O99331 Smoking (tobacco) complicating pregnancy, first trimester: Secondary | ICD-10-CM | POA: Diagnosis not present

## 2016-06-05 DIAGNOSIS — F17200 Nicotine dependence, unspecified, uncomplicated: Secondary | ICD-10-CM | POA: Insufficient documentation

## 2016-06-05 DIAGNOSIS — Z3A01 Less than 8 weeks gestation of pregnancy: Secondary | ICD-10-CM | POA: Diagnosis not present

## 2016-06-05 DIAGNOSIS — R109 Unspecified abdominal pain: Secondary | ICD-10-CM | POA: Insufficient documentation

## 2016-06-05 LAB — HCG, QUANTITATIVE, PREGNANCY: hCG, Beta Chain, Quant, S: 58 m[IU]/mL — ABNORMAL HIGH (ref <5)

## 2016-06-05 MED ORDER — KETOROLAC TROMETHAMINE 60 MG/2ML IM SOLN
60.0000 mg | Freq: Once | INTRAMUSCULAR | Status: DC
  Filled 2016-06-05: qty 2

## 2016-06-05 MED ORDER — ONDANSETRON 8 MG PO TBDP
8.0000 mg | ORAL_TABLET | Freq: Once | ORAL | Status: AC
  Administered 2016-06-05: 8 mg via ORAL
  Filled 2016-06-05: qty 1

## 2016-06-05 MED ORDER — KETOROLAC TROMETHAMINE 60 MG/2ML IM SOLN
60.0000 mg | Freq: Once | INTRAMUSCULAR | Status: AC
  Administered 2016-06-05: 60 mg via INTRAMUSCULAR
  Filled 2016-06-05: qty 2

## 2016-06-05 MED ORDER — IBUPROFEN 600 MG PO TABS: 600.0000 mg | ORAL_TABLET | Freq: Four times a day (QID) | ORAL | 1 refills | Status: DC | PRN

## 2016-06-05 MED ORDER — HYDROMORPHONE HCL 1 MG/ML IJ SOLN
1.0000 mg | Freq: Once | INTRAMUSCULAR | Status: AC
  Administered 2016-06-05: 1 mg via INTRAMUSCULAR
  Filled 2016-06-05: qty 1

## 2016-06-05 MED ORDER — OXYCODONE-ACETAMINOPHEN 5-325 MG PO TABS: 1.0000 | ORAL_TABLET | Freq: Four times a day (QID) | ORAL | 0 refills | Status: DC | PRN

## 2016-06-05 NOTE — MAU Note (Signed)
[facility] early [facility], with pain.  Hx of ectopic, required surgery.  States continues to having cramping. Has a headache. Just started bleeding last night.

## 2016-06-05 NOTE — Discharge Instructions (Signed)
Threatened Miscarriage A threatened miscarriage is when you have vaginal bleeding or pain during your first 20 weeks of pregnancy but the pregnancy has not ended. Your doctor will do tests to make sure you are still pregnant. The cause of the bleeding may not be known. This condition does not mean your pregnancy will end. It does increase the risk of it ending (complete miscarriage). Follow these instructions at home:  Make sure you keep all your doctor visits for prenatal care.  Get plenty of rest.  Do not have sex or use tampons if you have vaginal bleeding.  Do not douche.  Do not smoke or use drugs.  Do not drink alcohol.  Avoid caffeine. Contact a doctor if:  You have light bleeding from your vagina.  You have belly pain or cramping.  You have a fever. Get help right away if:  You have heavy bleeding from your vagina.  You have clots of blood coming from your vagina.  You have bad pain or cramps in your low back or belly.  You have fever, chills, and bad belly pain. This information is not intended to replace advice given to you by your health care provider. Make sure you discuss any questions you have with your health care provider. Document Released: 05/12/2008 Document Revised: 11/05/2015 Document Reviewed: 03/26/2013 Elsevier Interactive Patient Education  2017 [employer]

## 2016-06-05 NOTE — MAU Provider Note (Signed)
Chief Complaint: Follow-up   None       SUBJECTIVE HPI: Sarah Johns is a 25 y.o. G3P0010 at [redacted]w[redacted]d by LMP who presents to maternity admissions reporting increased pain/cramping and bleeding which started today.  Very upset and tearful  Vomiting.  . She denies vaginal itching/burning, urinary symptoms, h/a, dizziness, n/v, or fever/chills.   Here for followup HCG after being seen 2 days ago in ED.  Abdominal Pain  This is a new problem. The current episode started today. The onset quality is gradual. The problem occurs intermittently. The problem has been waxing and waning. The pain is located in the LLQ, RLQ and suprapubic region. The pain is moderate. The quality of the pain is cramping and sharp. The abdominal pain does not radiate. Associated symptoms include nausea and vomiting. Pertinent negatives include no anorexia, constipation, diarrhea, dysuria, fever, headaches or myalgias. Nothing aggravates the pain. The pain is relieved by nothing. She has tried nothing for the symptoms.   RN Note: Seen at [facility] on 12/22, early preg, with pain.  Hx of ectopic, required surgery.  States continues to having cramping. Has a headache. Just started bleeding last night.   Past Medical History:  Diagnosis Date  . Fever blister   . Gastritis   . Herpes    Past Surgical History:  Procedure Laterality Date  . DIAGNOSTIC LAPAROSCOPY WITH REMOVAL OF ECTOPIC PREGNANCY N/A 09/21/2015   Procedure: DIAGNOSTIC LAPAROSCOPY Salpingectomy with REMOVAL OF ECTOPIC PREGNANCY;  Surgeon: Lesly Dukes, MD;  Location: [facility];  Service: Gynecology;  Laterality: N/A;   Social History   Social History  . Marital status: Single    Spouse name: N/A  . Number of children: N/A  . Years of education: N/A   Occupational History  . Not on file.   Social History Main Topics  . Smoking status: Current Every Day Smoker  . Smokeless tobacco: Never Used  . Alcohol use No  . Drug use: No  . Sexual activity: Yes     Birth control/ protection: None   Other Topics Concern  . Not on file   Social History Narrative  . No narrative on file   No current facility-administered medications on file prior to encounter.    No current outpatient prescriptions on file prior to encounter.   Allergies  Allergen Reactions  . Cherry Itching and Rash    I have reviewed patient's Past Medical Hx, Surgical Hx, Family Hx, Social Hx, medications and allergies.   ROS:  Review of Systems  Constitutional: Negative for fever.  Gastrointestinal: Positive for abdominal pain, nausea and vomiting. Negative for anorexia, constipation and diarrhea.  Genitourinary: Negative for dysuria.  Musculoskeletal: Negative for myalgias.  Neurological: Negative for headaches.   Review of Systems  Other systems negative   Physical Exam  Physical Exam Patient Vitals for the past 24 hrs:  BP Temp Temp src Pulse Resp Height Weight  06/05/16 1351 114/66 98.2 F (36.8 C) Oral 83 16 5\' 5"  (1.651 m) 137 lb 6.4 oz (62.3 kg)   Constitutional: Well-developed, well-nourished female in no acute distress.  Cardiovascular: normal rate Respiratory: normal effort GI: Abd soft, moderately tender in lower abdomen, with no focal tenderness. Pos BS x 4 MS: Extremities nontender, no edema, normal ROM Neurologic: Alert and oriented x 4.  GU: Neg CVAT.  PELVIC EXAM: deferred since I will be getting a repeat US.  LAB RESULTS No results found for this or any previous visit (from the past 24 hour(s)).  --/--/  O POS, O POS (04/10 2201)  IMAGING [place] Ob Comp Less 14 Wks  Result Date: 06/03/2016 CLINICAL DATA:  Nausea and vomiting, suprapubic tenderness for 1 day history of ectopic EXAM: OBSTETRIC <14 WK US AND TRANSVAGINAL OB US TECHNIQUE: Both transabdominal and transvaginal ultrasound examinations were performed for complete evaluation of the gestation as well as the maternal uterus, adnexal regions, and pelvic cul-de-sac. Transvaginal technique  was performed to assess early pregnancy. COMPARISON:  CT 09/25/2015, prior ultrasound 09/21/2015 FINDINGS: Intrauterine gestational sac: Not visualized Yolk sac:  Not visualized Embryo:  Not visualized Cardiac Activity: Not visualized Maternal uterus/adnexae: Small posterior uterine fibroid measuring 0.9 x 0.8 x 0.8 cm. Right ovary within normal limits and measures 3.7 x 2 x 1.8 cm. Left ovary measures approximately 2.2 by 3.6 x 2.7 cm. It cannot be separated from a soft tissue echogenicity with peripheral hyperemia. Trace fluid in the cul de sac, simple appearing. IMPRESSION: 1. No intrauterine gestational sac, yolk sac or fetal pole is visualized. Left ovary cannot be separated from a mildly hyperemic soft tissue echogenicity. Findings could relate to early pregnancy with hemorrhagic corpus luteal cyst, however ectopic pregnancy cannot be excluded given absence of intrauterine pregnancy and adnexal soft tissue echogenicity. Close clinical follow-up and correlation with serial quantitative beta HCG is advised. Repeat ultrasound as indicated. 2. Trace anechoic fluid in the pelvis. Electronically Signed   By: Jasmine PangKim  Fujinaga M.D.   On: 06/03/2016 17:23   [place] Ob Transvaginal  Result Date: 06/03/2016 CLINICAL DATA:  Nausea and vomiting, suprapubic tenderness for 1 day history of ectopic EXAM: OBSTETRIC <14 WK US AND TRANSVAGINAL OB US TECHNIQUE: Both transabdominal and transvaginal ultrasound examinations were performed for complete evaluation of the gestation as well as the maternal uterus, adnexal regions, and pelvic cul-de-sac. Transvaginal technique was performed to assess early pregnancy. COMPARISON:  CT 09/25/2015, prior ultrasound 09/21/2015 FINDINGS: Intrauterine gestational sac: Not visualized Yolk sac:  Not visualized Embryo:  Not visualized Cardiac Activity: Not visualized Maternal uterus/adnexae: Small posterior uterine fibroid measuring 0.9 x 0.8 x 0.8 cm. Right ovary within normal limits and measures  3.7 x 2 x 1.8 cm. Left ovary measures approximately 2.2 by 3.6 x 2.7 cm. It cannot be separated from a soft tissue echogenicity with peripheral hyperemia. Trace fluid in the cul de sac, simple appearing. IMPRESSION: 1. No intrauterine gestational sac, yolk sac or fetal pole is visualized. Left ovary cannot be separated from a mildly hyperemic soft tissue echogenicity. Findings could relate to early pregnancy with hemorrhagic corpus luteal cyst, however ectopic pregnancy cannot be excluded given absence of intrauterine pregnancy and adnexal soft tissue echogenicity. Close clinical follow-up and correlation with serial quantitative beta HCG is advised. Repeat ultrasound as indicated. 2. Trace anechoic fluid in the pelvis. Electronically Signed   By: Jasmine PangKim  Fujinaga M.D.   On: 06/03/2016 17:23    MAU Management/MDM:  Consult Dr Jolayne Pantheronstant with presentation, exam findings, and results.  US two days ago showed questionable ectopic area. Given that and her recent history of surgical ectopic, she recommends repeat US even though it is within a short interval Treatments in MAU included Dilaudid and Toradol.  States toradol helped more than the Dilaudid.   This bleeding/pain can represent a normal pregnancy with bleeding, spontaneous abortion or even an ectopic which can be life-threatening.  The process as listed above helps to determine which of these is present.  US showed less suspicion for ectopic now.  Clots in lower uterine segment are more suggestive of  SAB inprogress.   Will follow quants.  ASSESSMENT Pregnancy at 7669w6d by LMP Likely SAB in progress Ectopic less likely Pregnancy of unknown location  PLAN Discharge home Plan to repeat HCG level in 48 hours in clinic per 11:00 am schedule Will repeat  Ultrasound in about 7-10 days if HCG levels double appropriately  Strict ectopic precautions Rx Ibuprofen and Percocet for pain. Warned about driving while impaired   Pt stable at time of  discharge. Encouraged to return here or to other Urgent Care/ED if she develops worsening of symptoms, increase in pain, fever, or other concerning symptoms.    Wynelle BourgeoisMarie Salvatore Poe CNM, MSN Certified Nurse-Midwife 06/05/2016  2:18 PM

## 2016-06-07 LAB — GC/CHLAMYDIA PROBE AMP (~~LOC~~) NOT AT ARMC
Chlamydia: NEGATIVE
NEISSERIA GONORRHEA: NEGATIVE

## 2016-06-09 ENCOUNTER — Ambulatory Visit: Payer: Medicaid Other

## 2016-06-09 ENCOUNTER — Encounter: Payer: Self-pay | Admitting: General Practice

## 2016-06-16 ENCOUNTER — Telehealth: Payer: Self-pay

## 2016-06-16 NOTE — Telephone Encounter (Signed)
Attempted to reach patient to and there is not a working number. Armandina StammerJennifer Howard RNBSN

## 2016-06-16 NOTE — Telephone Encounter (Signed)
-----   Message from Darrel HooverKelly P Rassette, RN sent at 06/15/2016 10:58 AM EST ----- Regarding: Please call Redgie GrayerHey Janee Ureste, Happy New Year!  Could you see this patient for a STAT Beta hCG?  She was supposed to be seen for this on 06/09/16 after a visit to [facility] on 06/05/16.  She says she doesn't have transportation to get to our office.  Would you call her and see if she can come in today or tomorrow?  She doesn't need to see a provider.  If you can just get the result and speak with the provider and then call the patient with a plan. Thanks, Bed Bath & BeyondKelly

## 2017-01-09 IMAGING — US US OB COMP LESS 14 WK
1 series · 14 of 28 positions shown · non-contrast
Comparison: None.

CLINICAL DATA: Vaginal bleeding.

EXAM:
OBSTETRIC <14 WK US AND TRANSVAGINAL OB US
TECHNIQUE: Both transabdominal and transvaginal ultrasound examinations were
performed for complete evaluation of the gestation as well as the
maternal uterus, adnexal regions, and pelvic cul-de-sac.
Transvaginal technique was performed to assess early pregnancy.

[Series 1: us ob comp less 14 wk · 0.20mm/px · 14 of 38 slices shown]
[im 2/38]
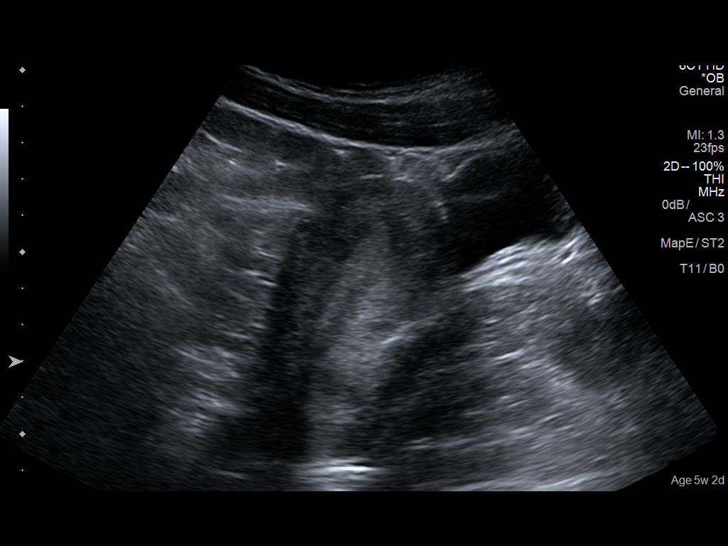
[im 5/38]
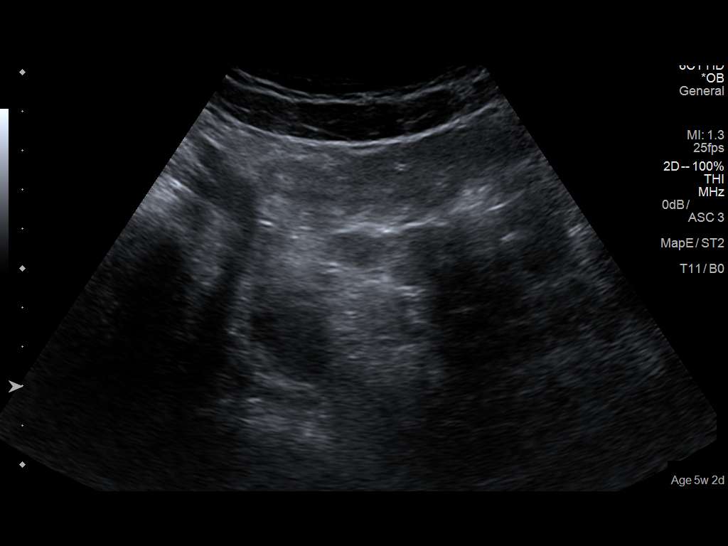
[im 7/38]
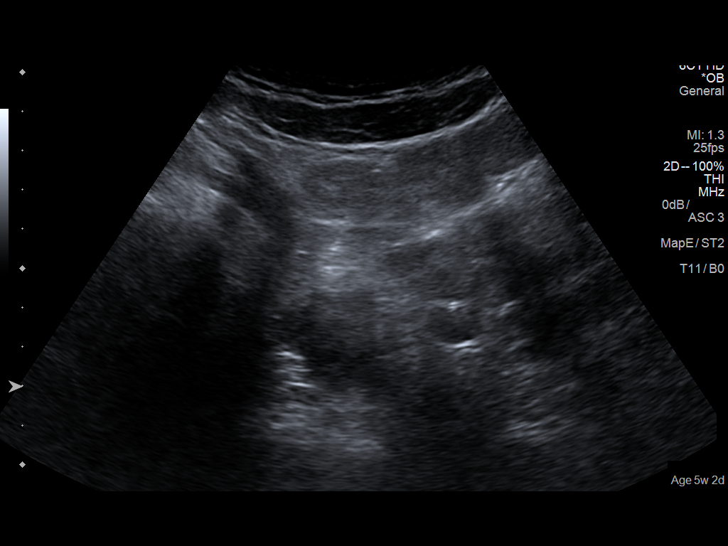
[im 10/38]
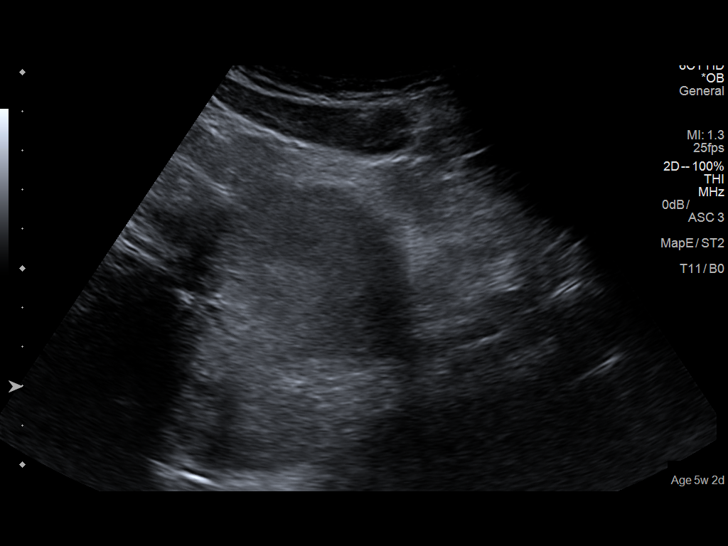
[im 13/38]
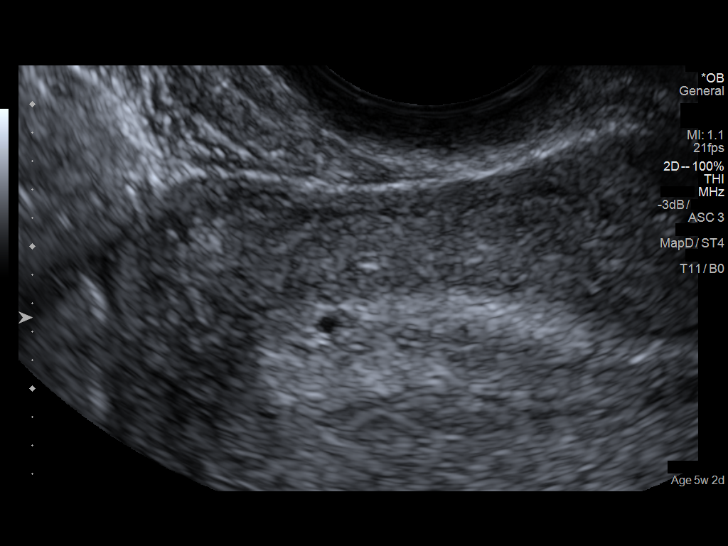
[im 16/38]
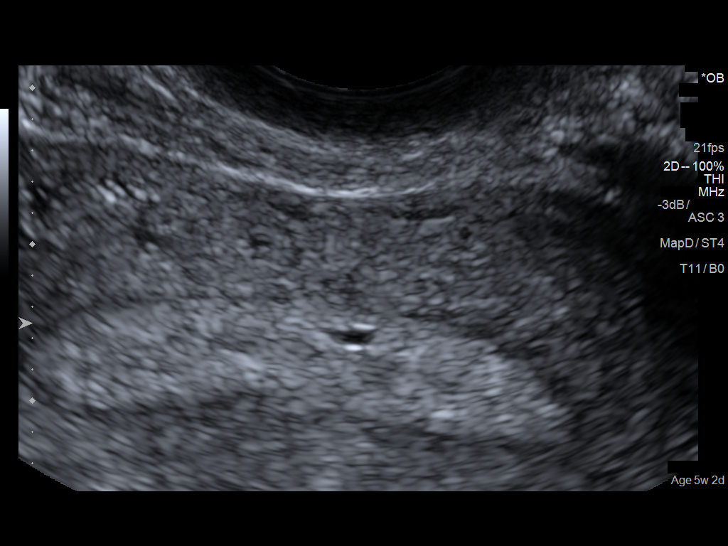
[im 18/38]
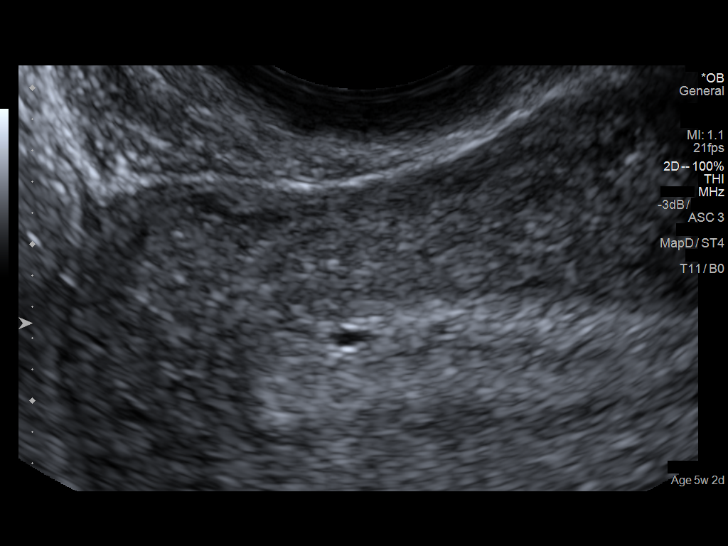
[im 21/38]
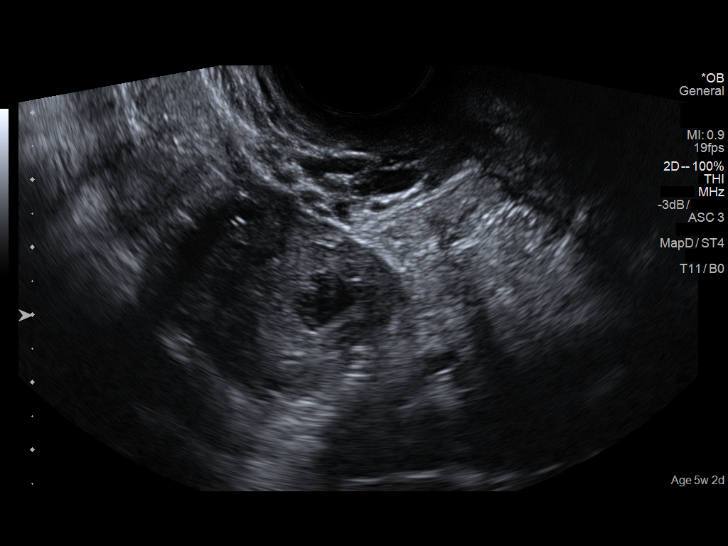
[im 24/38]
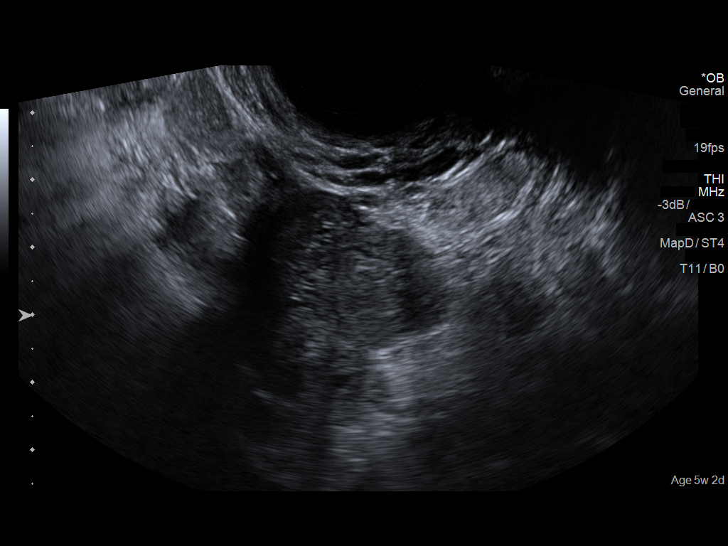
[im 27/38]
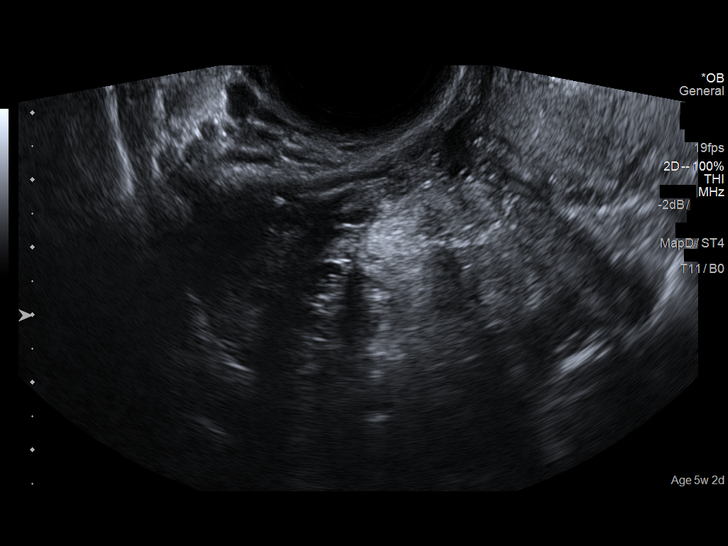
[im 29/38]
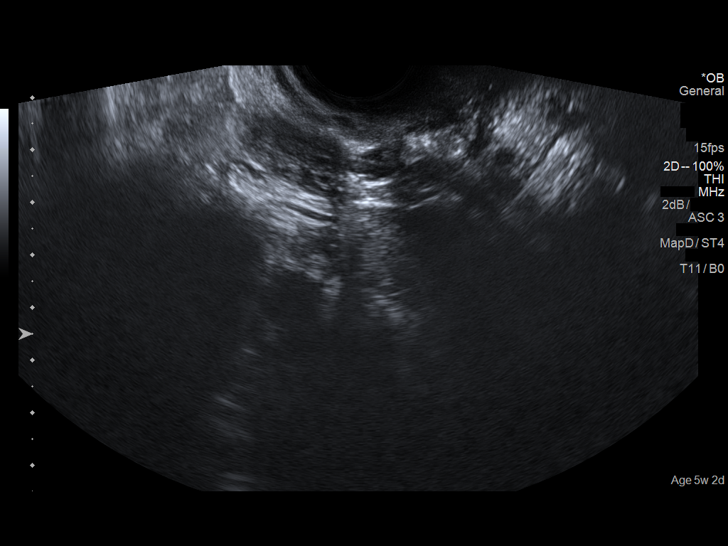
[im 32/38]
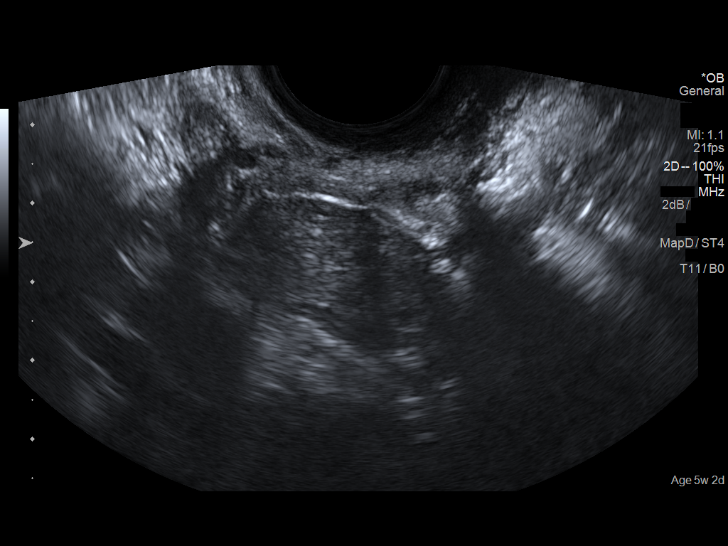
[im 35/38]
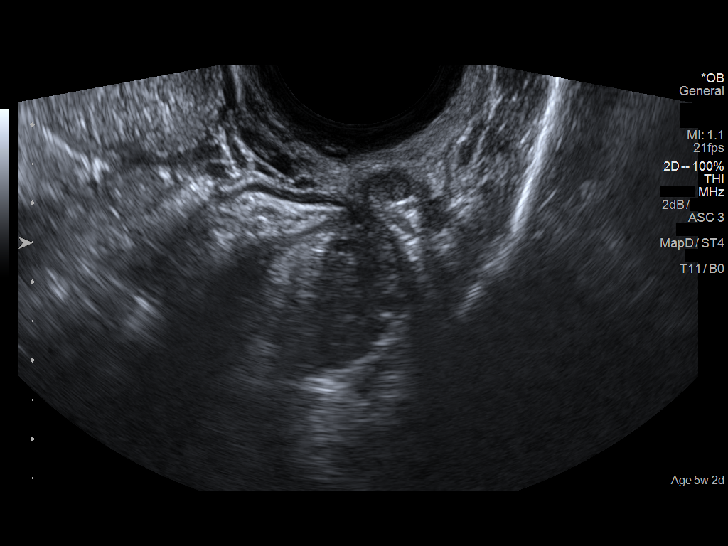
[im 38/38]
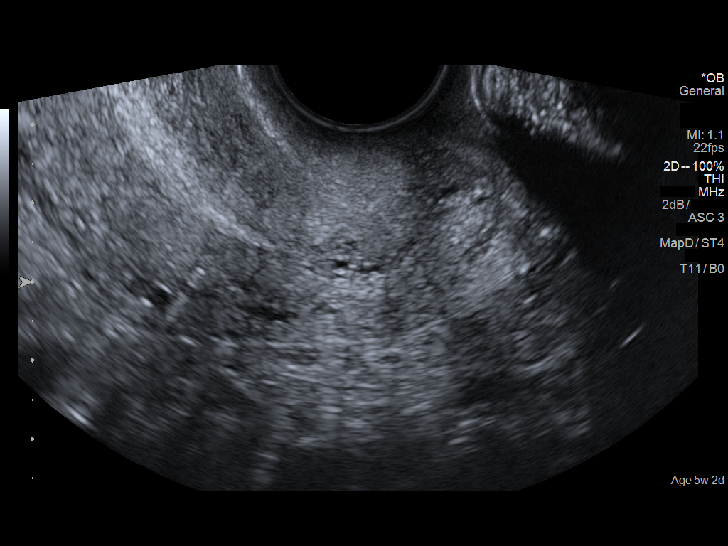

[14 of 28 positions shown; findings below may reference images not displayed]

FINDINGS: Intrauterine gestational sac: Single

Yolk sac:  No

Embryo:  No

Cardiac Activity: Not applicable

Heart Rate: Not applicable  bpm

MSD: 2.2  mm   4 w   6  d

Subchorionic hemorrhage:  None visualized.

Maternal uterus/adnexae:

Subchorionic hemorrhage: None

Right ovary: Normal

Left ovary: Normal

Other :None

Free fluid:  None
IMPRESSION: 1. Probable early intrauterine gestational sac, but no yolk sac,
fetal pole, or cardiac activity yet visualized. Recommend follow-up
quantitative B-HCG levels and follow-up US in 14 days to confirm and
assess viability. This recommendation follows SRU consensus
guidelines: Diagnostic Criteria for Nonviable Pregnancy Early in the
First Trimester. N Engl J Med 4345; [DATE].

## 2017-04-10 ENCOUNTER — Encounter (HOSPITAL_COMMUNITY): Payer: Self-pay

## 2017-12-12 ENCOUNTER — Other Ambulatory Visit: Payer: Self-pay

## 2017-12-12 ENCOUNTER — Observation Stay (HOSPITAL_BASED_OUTPATIENT_CLINIC_OR_DEPARTMENT_OTHER)
Admission: EM | Admit: 2017-12-12 | Discharge: 2017-12-13 | Disposition: A | Discharge status: Other Acute Inpatient Hospital | Payer: Medicaid Other | Attending: Family Medicine | Admitting: Family Medicine

## 2017-12-12 ENCOUNTER — Encounter (HOSPITAL_BASED_OUTPATIENT_CLINIC_OR_DEPARTMENT_OTHER): Payer: Self-pay

## 2017-12-12 DIAGNOSIS — O210 Mild hyperemesis gravidarum: Secondary | ICD-10-CM | POA: Diagnosis present

## 2017-12-12 DIAGNOSIS — Z87891 Personal history of nicotine dependence: Secondary | ICD-10-CM | POA: Diagnosis not present

## 2017-12-12 DIAGNOSIS — Z79899 Other long term (current) drug therapy: Secondary | ICD-10-CM | POA: Diagnosis not present

## 2017-12-12 DIAGNOSIS — Z3A01 Less than 8 weeks gestation of pregnancy: Secondary | ICD-10-CM | POA: Diagnosis not present

## 2017-12-12 DIAGNOSIS — O26891 Other specified pregnancy related conditions, first trimester: Secondary | ICD-10-CM | POA: Diagnosis not present

## 2017-12-12 DIAGNOSIS — O219 Vomiting of pregnancy, unspecified: Secondary | ICD-10-CM | POA: Diagnosis not present

## 2017-12-12 LAB — CBC WITH DIFFERENTIAL/PLATELET
Basophils Absolute: 0 10*3/uL (ref 0.0–0.1)
Basophils Relative: 0 %
Eosinophils Absolute: 0.1 10*3/uL (ref 0.0–0.7)
Eosinophils Relative: 1 %
HCT: 28.2 % — ABNORMAL LOW (ref 36.0–46.0)
HEMOGLOBIN: 9.7 g/dL — AB (ref 12.0–15.0)
LYMPHS ABS: 2 10*3/uL (ref 0.7–4.0)
LYMPHS PCT: 16 %
MCH: 28.1 pg (ref 26.0–34.0)
MCHC: 34.4 g/dL (ref 30.0–36.0)
MCV: 81.7 fL (ref 78.0–100.0)
Monocytes Absolute: 1.1 10*3/uL — ABNORMAL HIGH (ref 0.1–1.0)
Monocytes Relative: 9 %
Neutro Abs: 9.5 10*3/uL — ABNORMAL HIGH (ref 1.7–7.7)
Neutrophils Relative %: 74 %
Platelets: 274 10*3/uL (ref 150–400)
RBC: 3.45 MIL/uL — AB (ref 3.87–5.11)
RDW: 16.3 % — ABNORMAL HIGH (ref 11.5–15.5)
WBC: 12.6 10*3/uL — AB (ref 4.0–10.5)

## 2017-12-12 LAB — URINALYSIS, ROUTINE W REFLEX MICROSCOPIC
BILIRUBIN URINE: NEGATIVE
Glucose, UA: NEGATIVE mg/dL
HGB URINE DIPSTICK: NEGATIVE
Ketones, ur: >80 mg/dL — AB
Leukocytes, UA: NEGATIVE
Nitrite: NEGATIVE
PROTEIN: 100 mg/dL — AB
SPECIFIC GRAVITY, URINE: 1.02 (ref 1.005–1.030)
pH: 6.5 (ref 5.0–8.0)

## 2017-12-12 LAB — COMPREHENSIVE METABOLIC PANEL
ALT: 8 U/L (ref 0–44)
AST: 24 U/L (ref 15–41)
Albumin: 4 g/dL (ref 3.5–5.0)
Alkaline Phosphatase: 61 U/L (ref 38–126)
Anion gap: 8 (ref 5–15)
BILIRUBIN TOTAL: 0.3 mg/dL (ref 0.3–1.2)
BUN: 6 mg/dL (ref 6–20)
CALCIUM: 8.8 mg/dL — AB (ref 8.9–10.3)
CO2: 22 mmol/L (ref 22–32)
CREATININE: 0.49 mg/dL (ref 0.44–1.00)
Chloride: 101 mmol/L (ref 98–111)
Glucose, Bld: 117 mg/dL — ABNORMAL HIGH (ref 70–99)
Potassium: 2.9 mmol/L — ABNORMAL LOW (ref 3.5–5.1)
Sodium: 131 mmol/L — ABNORMAL LOW (ref 135–145)
Total Protein: 7.3 g/dL (ref 6.5–8.1)

## 2017-12-12 LAB — URINALYSIS, MICROSCOPIC (REFLEX)

## 2017-12-12 LAB — LIPASE, BLOOD: LIPASE: 24 U/L (ref 11–51)

## 2017-12-12 MED ORDER — METOCLOPRAMIDE HCL 5 MG/ML IJ SOLN
5.0000 mg | Freq: Once | INTRAMUSCULAR | Status: AC
  Administered 2017-12-12: 5 mg via INTRAVENOUS
  Filled 2017-12-12: qty 2

## 2017-12-12 MED ORDER — SODIUM CHLORIDE 0.9 % IV BOLUS
1000.0000 mL | Freq: Once | INTRAVENOUS | Status: DC
  Administered 2017-12-12: 1000 mL via INTRAVENOUS

## 2017-12-12 MED ORDER — PROMETHAZINE HCL 25 MG RE SUPP: 25.0000 mg | Freq: Four times a day (QID) | RECTAL | 0 refills | Status: AC

## 2017-12-12 MED ORDER — ONDANSETRON HCL 4 MG/2ML IJ SOLN
4.0000 mg | Freq: Once | INTRAMUSCULAR | Status: AC
  Administered 2017-12-12: 4 mg via INTRAVENOUS
  Filled 2017-12-12: qty 2

## 2017-12-12 MED ORDER — DEXTROSE-NACL 5-0.9 % IV SOLN
INTRAVENOUS | Status: DC
Start: ? — End: 2017-12-12

## 2017-12-12 MED ORDER — DEXTROSE 5 % IN LACTATED RINGERS IV BOLUS
1000.0000 mL | Freq: Once | INTRAVENOUS | Status: AC
  Administered 2017-12-13: 1000 mL via INTRAVENOUS

## 2017-12-12 MED ORDER — POTASSIUM CHLORIDE 10 MEQ/100ML IV SOLN
10.0000 meq | Freq: Once | INTRAVENOUS | Status: AC
  Administered 2017-12-12: 10 meq via INTRAVENOUS
  Filled 2017-12-12: qty 100

## 2017-12-12 NOTE — ED Notes (Signed)
PT is vomiting after fluid challenge.

## 2017-12-12 NOTE — ED Provider Notes (Signed)
[facility] EMERGENCY DEPARTMENT Provider Note  CSN: [id number] Arrival date & time: 12/12/17  1941  History   Chief Complaint Chief Complaint  Patient presents with  . Emesis    HPI Sarah Johns is a 27 y.o. female 197A6 with a medical history of HSV who presented to the ED for vomiting x5 days. Patient is [redacted] weeks pregnant. She states that she has decreased PO intake since last week. She states that she went to [facility] ED yesterday for the same complaint, but she was only discharged with pain medication which caused more nausea. Endorses left sided flank pain. Denies fever, vaginal/pelvic pain, vaginal bleeding or discharge.   Past Medical History:  Diagnosis Date  . Fever blister   . Gastritis   . Herpes     There are no active problems to display for this patient.   Past Surgical History:  Procedure Laterality Date  . DIAGNOSTIC LAPAROSCOPY WITH REMOVAL OF ECTOPIC PREGNANCY N/A 09/21/2015   Procedure: DIAGNOSTIC LAPAROSCOPY Salpingectomy with REMOVAL OF ECTOPIC PREGNANCY;  Surgeon: Lesly DukesKelly H Leggett, MD;  Location: [facility];  Service: Gynecology;  Laterality: N/A;     OB History    Gravida  6   Para  0   Term      Preterm      AB  3   Living  0     SAB  2   TAB  0   Ectopic  1   Multiple      Live Births  0            Home Medications    Prior to Admission medications   Medication Sig Start Date End Date Taking? Authorizing Provider  IRON PO Take by mouth. 11/29/17  Yes [provider]  metoCLOPramide (REGLAN) 10 MG tablet Take by mouth. 12/11/17 12/21/17 Yes [provider]  cephALEXin (KEFLEX) 500 MG capsule Take 1 capsule (500 mg total) by mouth 2 (two) times daily for 5 days. 12/13/17 12/18/17  Mortis, Jerrel IvoryGabrielle I, PA-C  ibuprofen (ADVIL,MOTRIN) 600 MG tablet Take 1 tablet (600 mg total) by mouth every 6 (six) hours as needed. 06/05/16   Aviva SignsWilliams, Marie L, CNM  oxyCODONE-acetaminophen (PERCOCET/ROXICET) 5-325 MG tablet  Take 1-2 tablets by mouth every 6 (six) hours as needed. 06/05/16   Aviva SignsWilliams, Marie L, CNM  promethazine (PHENERGAN) 25 MG suppository Place 1 suppository (25 mg total) rectally 4 (four) times daily for 7 days. 12/12/17 12/19/17  Mortis, Sharyon MedicusGabrielle I, PA-C    Family History Family History  Problem Relation Age of Onset  . Diabetes Mother   . Hypertension Mother   . Asthma Mother   . Cancer Father   . Cancer Paternal Grandmother     Social History Social History   Tobacco Use  . Smoking status: Former Games developermoker  . Smokeless tobacco: Never Used  Substance Use Topics  . Alcohol use: No  . Drug use: No     Allergies   Cherry   Review of Systems Review of Systems  Constitutional: Positive for appetite change. Negative for activity change, fatigue and fever.  Gastrointestinal: Positive for abdominal pain and vomiting. Negative for constipation, diarrhea and nausea.  Genitourinary: Negative for pelvic pain, vaginal bleeding, vaginal discharge and vaginal pain.  Skin: Negative.   Neurological: Negative for dizziness, weakness, light-headedness, numbness and headaches.     Physical Exam Updated Vital Signs BP (!) 103/57 (BP Location: Right Arm)   Pulse 77   Temp 98.5 F (36.9  C) (Oral)   Resp 20   Ht 5\' 4"  (1.626 m)   Wt 56.8 kg (125 lb 2 oz)   SpO2 100%   BMI 21.48 kg/m   Physical Exam  Constitutional: She appears well-developed and well-nourished. She is cooperative. She appears ill.  HENT:  Mouth/Throat: Uvula is midline and oropharynx is clear and moist. Mucous membranes are dry.  Eyes: Pupils are equal, round, and reactive to light. Conjunctivae, EOM and lids are normal.  Cardiovascular: Normal rate, regular rhythm, normal heart sounds and intact distal pulses.  Pulmonary/Chest: Effort normal and breath sounds normal.  Abdominal: Soft. Bowel sounds are normal. There is tenderness in the left upper quadrant and left lower quadrant. There is no CVA tenderness.    Musculoskeletal:       Arms: Left flank tenderness.  Neurological: She is alert.  Skin: Skin is warm and intact.  Nursing note and vitals reviewed.    ED Treatments / Results  Labs (all labs ordered are listed, but only abnormal results are displayed) Labs Reviewed  CBC WITH DIFFERENTIAL/PLATELET - Abnormal; Notable for the following components:      Result Value   WBC 12.6 (*)    RBC 3.45 (*)    Hemoglobin 9.7 (*)    HCT 28.2 (*)    RDW 16.3 (*)    Neutro Abs 9.5 (*)    Monocytes Absolute 1.1 (*)    All other components within normal limits  COMPREHENSIVE METABOLIC PANEL - Abnormal; Notable for the following components:   Sodium 131 (*)    Potassium 2.9 (*)    Glucose, Bld 117 (*)    Calcium 8.8 (*)    All other components within normal limits  URINALYSIS, ROUTINE W REFLEX MICROSCOPIC - Abnormal; Notable for the following components:   APPearance CLOUDY (*)    Ketones, ur >80 (*)    Protein, ur 100 (*)    All other components within normal limits  URINALYSIS, MICROSCOPIC (REFLEX) - Abnormal; Notable for the following components:   Bacteria, UA MANY (*)    All other components within normal limits  LIPASE, BLOOD    EKG EKG Interpretation  Date/Time:  Tuesday December 12 2017 22:29:31 EDT Ventricular Rate:  76 PR Interval:    QRS Duration: 97 QT Interval:  382 QTC Calculation: 430 R Axis:   60 Text Interpretation:  Unknown rhythm, irregular rate Baseline wander in lead(s) V5 No old tracing to compare Confirmed by Melene Plan [phone]) on 12/12/2017 10:37:14 PM   Radiology No results found.  Procedures Procedures (including critical care time)  Medications Ordered in ED Medications  dextrose 5% lactated ringers bolus 1,000 mL (has no administration in time range)  dextrose 50 % solution (has no administration in time range)  potassium chloride 10 mEq in 100 mL IVPB (0 mEq Intravenous Stopped 12/12/17 2343)  metoCLOPramide (REGLAN) injection 5 mg (5 mg Intravenous  Given 12/12/17 2259)  ondansetron (ZOFRAN) injection 4 mg (4 mg Intravenous Given 12/12/17 2345)     Initial Impression / Assessment and Plan / ED Course  Triage vital signs and the nursing notes have been reviewed.  Pertinent labs & imaging results that were available during care of the patient were reviewed and considered in medical decision making (see chart for details).  Clinical Course as of Dec 14 23  Tue Dec 12, 2017  2120 HCG > 150,000 in the [facility] ED yesterday 12/11/17.   [GM]  2212 Hypokalemic at 2.9. Possibly 2/2 to excessive  vomiting. Will check EKG for cardiac abnormalities. Ordered IV potassium.   [GM]  2223 Bedside ultrasound performed. No free fluid in abdomen or pelvis visualized.   [GM]  2320 EKG normal. NSR. No T wave abnormalities. No ST changes or signs of acute ischemia or infarct.   [GM]  Wed Dec 13, 2017  0011 Case discussed with on call OB/GYN. Patient can be managed as an outpatient. Advised to give patient LR in D5W prior to discharge and to prescribe Phenergan suppositories.    [GM]  0016 Bacteria seen in UA given pregnancy will prescribe Keflex to be taken as outpatient as well.  Leukocytes, UA: NEGATIVE [GM]    Clinical Course User Index [GM] Mortis, Sharyon Medicus, PA-C   Patient presents ill, but vital signs are normal. Patient has no s/s to suggest that there is an issue with the pregnancy, such as spontaneous abortion or ectopic. She has vomited in the ED multiple despite antiemetics and IV fluids given. Case discussed with on-call OB/GYN provider. Recommendation is to give IV LR in D5W to correct ketones seen in urine and discharge patient with Phenergan suppositories. Patient is established with an OB/GYN and has an appointment scheduled for next week.  Final Clinical Impressions(s) / ED Diagnoses  1. Vomiting in Pregnancy. Phernergan suppositories prescribed. Advised patient to remain hydrated. Advised to call OB/GYN to see if she can get an  earlier appointment. 2. Asymptomatic Bacteriuria. Keflex x5 days prescribed.  Dispo: Home. After thorough clinical evaluation, this patient is determined to be medically stable and can be safely discharged with the previously mentioned treatment and/or outpatient follow-up/referral(s). At this time, there are no other apparent medical conditions that require further screening, evaluation or treatment.  Final diagnoses:  Vomiting during pregnancy  Asymptomatic bacteriuria during pregnancy    ED Discharge Orders        Ordered    cephALEXin (KEFLEX) 500 MG capsule  2 times daily     12/13/17 0022    promethazine (PHENERGAN) 25 MG suppository  4 times daily     12/12/17 2358        Mortis, [place] I, PA-C 12/13/17 0026    Melene Plan, DO 12/13/17 [phone]

## 2017-12-12 NOTE — ED Triage Notes (Signed)
C/o n/v x 5 days-pt 7.[redacted] weeks pregnant-Dr Shawnie Ponsorn, OB-NAD-steady gait

## 2017-12-13 DIAGNOSIS — O210 Mild hyperemesis gravidarum: Secondary | ICD-10-CM | POA: Diagnosis present

## 2017-12-13 MED ORDER — SODIUM CHLORIDE 0.9 % IV SOLN
2.0000 g | Freq: Once | INTRAVENOUS | Status: AC
  Administered 2017-12-13: 2 g via INTRAVENOUS
  Filled 2017-12-13: qty 20

## 2017-12-13 MED ORDER — DEXTROSE-NACL 5-0.45 % IV SOLN
INTRAVENOUS | Status: DC
  Administered 2017-12-13: 08:00:00 via INTRAVENOUS

## 2017-12-13 MED ORDER — DEXTROSE 50 % IV SOLN
INTRAVENOUS | Status: AC
  Filled 2017-12-13: qty 100

## 2017-12-13 MED ORDER — PROMETHAZINE HCL 25 MG/ML IJ SOLN
12.5000 mg | Freq: Once | INTRAMUSCULAR | Status: AC
  Administered 2017-12-13: 12.5 mg via INTRAVENOUS
  Filled 2017-12-13: qty 1

## 2017-12-13 MED ORDER — CEPHALEXIN 500 MG PO CAPS: 500.0000 mg | ORAL_CAPSULE | Freq: Two times a day (BID) | ORAL | 0 refills | Status: AC

## 2017-12-13 MED ORDER — POTASSIUM CHLORIDE 10 MEQ/100ML IV SOLN
10.0000 meq | INTRAVENOUS | Status: AC
  Administered 2017-12-13 (×4): 10 meq via INTRAVENOUS
  Filled 2017-12-13 (×4): qty 100

## 2017-12-13 NOTE — Discharge Instructions (Addendum)
I spoke with the on-call OB/GYN at [facility] about you.  Call your OB/GYN ASAP in the morning to see if you can get an earlier appointment. Take the medication I prescribed, Phenergan, for nausea/vomiting at home. Your nausea and vomiting will not get resolved overnight and will take time. Vomiting tends to improve at the end of the first trimester.  There also was bacteria in your urine. Will treat as an UTI with Keflex.

## 2017-12-13 NOTE — ED Notes (Signed)
Ambulated to restroom  Pt states she feels weak  Pt had to stop multiple times on the way to and from the restroom

## 2017-12-13 NOTE — ED Notes (Signed)
Pt resting quietly with eyes closed    No acute distress noted

## 2017-12-13 NOTE — ED Notes (Signed)
Called [facility] line for consult to [facility].

## 2017-12-13 NOTE — ED Notes (Signed)
Pt back to bed with assist  Pt states she continues to feel weak and continues to have nausea and vomiting   Visitor at bedside

## 2017-12-14 LAB — URINE CULTURE: Culture: NO GROWTH

## 2017-12-26 ENCOUNTER — Emergency Department (HOSPITAL_BASED_OUTPATIENT_CLINIC_OR_DEPARTMENT_OTHER): Payer: Medicaid Other

## 2017-12-26 ENCOUNTER — Emergency Department (HOSPITAL_BASED_OUTPATIENT_CLINIC_OR_DEPARTMENT_OTHER)
Admission: EM | Admit: 2017-12-26 | Discharge: 2017-12-26 | Disposition: A | Discharge status: Home / Self Care | Payer: Medicaid Other | Attending: Emergency Medicine | Admitting: Emergency Medicine

## 2017-12-26 ENCOUNTER — Other Ambulatory Visit: Payer: Self-pay

## 2017-12-26 ENCOUNTER — Encounter (HOSPITAL_BASED_OUTPATIENT_CLINIC_OR_DEPARTMENT_OTHER): Payer: Self-pay | Admitting: Emergency Medicine

## 2017-12-26 DIAGNOSIS — Z3A00 Weeks of gestation of pregnancy not specified: Secondary | ICD-10-CM | POA: Diagnosis not present

## 2017-12-26 DIAGNOSIS — R1115 Cyclical vomiting syndrome unrelated to migraine: Secondary | ICD-10-CM

## 2017-12-26 DIAGNOSIS — O9989 Other specified diseases and conditions complicating pregnancy, childbirth and the puerperium: Secondary | ICD-10-CM | POA: Insufficient documentation

## 2017-12-26 DIAGNOSIS — Z79899 Other long term (current) drug therapy: Secondary | ICD-10-CM | POA: Diagnosis not present

## 2017-12-26 DIAGNOSIS — Z87891 Personal history of nicotine dependence: Secondary | ICD-10-CM | POA: Diagnosis not present

## 2017-12-26 DIAGNOSIS — O210 Mild hyperemesis gravidarum: Secondary | ICD-10-CM | POA: Insufficient documentation

## 2017-12-26 DIAGNOSIS — F12188 Cannabis abuse with other cannabis-induced disorder: Secondary | ICD-10-CM | POA: Diagnosis not present

## 2017-12-26 DIAGNOSIS — R1114 Bilious vomiting: Secondary | ICD-10-CM

## 2017-12-26 HISTORY — DX: Cannabis use, unspecified with other cannabis-induced disorder: F12.988

## 2017-12-26 HISTORY — DX: Nausea with vomiting, unspecified: R11.2

## 2017-12-26 LAB — COMPREHENSIVE METABOLIC PANEL
ALK PHOS: 55 U/L (ref 38–126)
ALT: 12 U/L (ref 0–44)
ANION GAP: 8 (ref 5–15)
AST: 22 U/L (ref 15–41)
Albumin: 3.7 g/dL (ref 3.5–5.0)
BUN: 6 mg/dL (ref 6–20)
CALCIUM: 8.9 mg/dL (ref 8.9–10.3)
CO2: 23 mmol/L (ref 22–32)
CREATININE: 0.53 mg/dL (ref 0.44–1.00)
Chloride: 103 mmol/L (ref 98–111)
Glucose, Bld: 90 mg/dL (ref 70–99)
Potassium: 3.4 mmol/L — ABNORMAL LOW (ref 3.5–5.1)
Sodium: 134 mmol/L — ABNORMAL LOW (ref 135–145)
Total Bilirubin: 0.4 mg/dL (ref 0.3–1.2)
Total Protein: 6.9 g/dL (ref 6.5–8.1)

## 2017-12-26 LAB — URINALYSIS, ROUTINE W REFLEX MICROSCOPIC
Bilirubin Urine: NEGATIVE
Glucose, UA: NEGATIVE mg/dL
HGB URINE DIPSTICK: NEGATIVE
Ketones, ur: 15 mg/dL — AB
LEUKOCYTES UA: NEGATIVE
NITRITE: NEGATIVE
PROTEIN: NEGATIVE mg/dL
Specific Gravity, Urine: 1.015 (ref 1.005–1.030)
pH: 8 (ref 5.0–8.0)

## 2017-12-26 LAB — CBC WITH DIFFERENTIAL/PLATELET
BASOS ABS: 0 10*3/uL (ref 0.0–0.1)
BASOS PCT: 0 %
EOS ABS: 0.1 10*3/uL (ref 0.0–0.7)
EOS PCT: 1 %
HCT: 28.7 % — ABNORMAL LOW (ref 36.0–46.0)
Hemoglobin: 9.9 g/dL — ABNORMAL LOW (ref 12.0–15.0)
Lymphocytes Relative: 18 %
Lymphs Abs: 2.1 10*3/uL (ref 0.7–4.0)
MCH: 28.9 pg (ref 26.0–34.0)
MCHC: 34.5 g/dL (ref 30.0–36.0)
MCV: 83.9 fL (ref 78.0–100.0)
Monocytes Absolute: 1.1 10*3/uL — ABNORMAL HIGH (ref 0.1–1.0)
Monocytes Relative: 9 %
Neutro Abs: 8.6 10*3/uL — ABNORMAL HIGH (ref 1.7–7.7)
Neutrophils Relative %: 72 %
PLATELETS: 341 10*3/uL (ref 150–400)
RBC: 3.42 MIL/uL — ABNORMAL LOW (ref 3.87–5.11)
RDW: 17.8 % — ABNORMAL HIGH (ref 11.5–15.5)
WBC: 11.9 10*3/uL — AB (ref 4.0–10.5)

## 2017-12-26 LAB — RAPID URINE DRUG SCREEN, HOSP PERFORMED
AMPHETAMINES: NOT DETECTED
BENZODIAZEPINES: NOT DETECTED
Cocaine: NOT DETECTED
OPIATES: NOT DETECTED
Tetrahydrocannabinol: POSITIVE — AB

## 2017-12-26 LAB — LIPASE, BLOOD: LIPASE: 20 U/L (ref 11–51)

## 2017-12-26 MED ORDER — METOCLOPRAMIDE HCL 10 MG PO TABS: 10.0000 mg | ORAL_TABLET | Freq: Four times a day (QID) | ORAL | 0 refills | Status: AC

## 2017-12-26 MED ORDER — SODIUM CHLORIDE 0.9 % IV BOLUS
1000.0000 mL | Freq: Once | INTRAVENOUS | Status: AC
  Administered 2017-12-26: 1000 mL via INTRAVENOUS

## 2017-12-26 MED ORDER — ONDANSETRON 4 MG PO TBDP: 4.0000 mg | ORAL_TABLET | Freq: Three times a day (TID) | ORAL | 0 refills | Status: AC | PRN

## 2017-12-26 MED ORDER — METOCLOPRAMIDE HCL 5 MG/ML IJ SOLN
10.0000 mg | Freq: Once | INTRAMUSCULAR | Status: AC
  Administered 2017-12-26: 10 mg via INTRAVENOUS
  Filled 2017-12-26: qty 2

## 2017-12-26 MED ORDER — CAPSAICIN 0.025 % EX CREA
TOPICAL_CREAM | Freq: Once | CUTANEOUS | Status: AC
Start: 2017-12-26 — End: 2017-12-26
  Administered 2017-12-26: 20:00:00 via TOPICAL
  Filled 2017-12-26 (×2): qty 60

## 2017-12-26 MED ORDER — PROMETHAZINE HCL 25 MG RE SUPP: 25.0000 mg | Freq: Four times a day (QID) | RECTAL | 0 refills | Status: DC | PRN

## 2017-12-26 NOTE — ED Provider Notes (Signed)
[facility] EMERGENCY DEPARTMENT Provider Note   CSN: [id number] Arrival date & time: 12/26/17  1658     History   Chief Complaint Chief Complaint  Patient presents with  . Emesis    HPI Sarah Johns is a 27 y.o. female.  HPI   Sarah Johns is a 27 y.o. female, with a history of G5P0 with 3 SAB and 1 ectopic, presenting to the ED with nausea and vomiting in pregnancy beginning again 7/13. Estimated due date 07/27/18. Estimates greater than 10 instances of emesis over last 24 hours. She notes some episodes have a bright green tinge. Last BM was today and normal. Generalized abdominal pain, "squeezing/pulling," 9/10, nonradiating, started after vomiting began. Has had several instances of these complaints. States she was seen here in this ED 12/12/17 and then transferred to [facility], admitted for 3 days.  Has tried zofran, B6, and scopolamine without success.  OBGYN: Dr. Shawnie Pons with [facility]  Denies fever/chills, diarrhea, hematochezia/melena, vaginal bleeding, abnormal discharge, urinary symptoms, or any other complaints.    Past Medical History:  Diagnosis Date  . Cannabinoid hyperemesis syndrome (HCC)   . Fever blister   . Gastritis   . Herpes     Patient Active Problem List   Diagnosis Date Noted  . Hyperemesis arising during pregnancy 12/13/2017    Past Surgical History:  Procedure Laterality Date  . DIAGNOSTIC LAPAROSCOPY WITH REMOVAL OF ECTOPIC PREGNANCY N/A 09/21/2015   Procedure: DIAGNOSTIC LAPAROSCOPY Salpingectomy with REMOVAL OF ECTOPIC PREGNANCY;  Surgeon: Lesly Dukes, MD;  Location: [facility];  Service: Gynecology;  Laterality: N/A;     OB History    Gravida  6   Para  0   Term      Preterm      AB  3   Living  0     SAB  2   TAB  0   Ectopic  1   Multiple      Live Births  0            Home Medications    Prior to Admission medications   Medication Sig Start Date End Date Taking? Authorizing Provider  ibuprofen  (ADVIL,MOTRIN) 600 MG tablet Take 1 tablet (600 mg total) by mouth every 6 (six) hours as needed. 06/05/16   Aviva Signs, CNM  IRON PO Take by mouth. 11/29/17   [provider]  metoCLOPramide (REGLAN) 10 MG tablet Take by mouth. 12/11/17 12/21/17  [provider]  metoCLOPramide (REGLAN) 10 MG tablet Take 1 tablet (10 mg total) by mouth every 6 (six) hours. 12/26/17   Joy, Shawn C, PA-C  ondansetron (ZOFRAN ODT) 4 MG disintegrating tablet Take 1 tablet (4 mg total) by mouth every 8 (eight) hours as needed for nausea or vomiting. 12/26/17   Joy, Shawn C, PA-C  oxyCODONE-acetaminophen (PERCOCET/ROXICET) 5-325 MG tablet Take 1-2 tablets by mouth every 6 (six) hours as needed. 06/05/16   Aviva Signs, CNM  promethazine (PHENERGAN) 25 MG suppository Place 1 suppository (25 mg total) rectally 4 (four) times daily for 7 days. 12/12/17 12/19/17  Mortis, Sharyon Medicus, PA-C  promethazine (PHENERGAN) 25 MG suppository Place 1 suppository (25 mg total) rectally every 6 (six) hours as needed for nausea or vomiting. 12/26/17   Joy, Hillard Danker, PA-C    Family History Family History  Problem Relation Age of Onset  . Diabetes Mother   . Hypertension Mother   . Asthma Mother   . Cancer Father   .  Cancer Paternal Grandmother     Social History Social History   Tobacco Use  . Smoking status: Former Games developermoker  . Smokeless tobacco: Never Used  Substance Use Topics  . Alcohol use: No  . Drug use: Yes    Types: Marijuana     Allergies   Cherry   Review of Systems Review of Systems  Constitutional: Negative for chills, diaphoresis and fever.  Respiratory: Negative for shortness of breath.   Cardiovascular: Negative for chest pain.  Gastrointestinal: Positive for abdominal pain, nausea and vomiting. Negative for blood in stool and diarrhea.  Genitourinary: Negative for dysuria, hematuria, vaginal bleeding and vaginal discharge.  Musculoskeletal: Negative for back pain.  All other  systems reviewed and are negative.    Physical Exam Updated Vital Signs BP 105/66 (BP Location: Right Arm)   Pulse 66   Temp 98 F (36.7 C) (Oral)   Resp 18   Ht 5\' 4"  (1.626 m)   Wt 58.1 kg (128 lb)   SpO2 100%   BMI 21.97 kg/m   Physical Exam  Constitutional: She appears well-developed and well-nourished. No distress.  HENT:  Head: Normocephalic and atraumatic.  Eyes: Conjunctivae are normal.  Neck: Neck supple.  Cardiovascular: Normal rate, regular rhythm, normal heart sounds and intact distal pulses.  Pulmonary/Chest: Effort normal and breath sounds normal. No respiratory distress.  Abdominal: Soft. There is tenderness. There is no guarding.  Indicates tenderness verbally only.   Musculoskeletal: She exhibits no edema.  Lymphadenopathy:    She has no cervical adenopathy.  Neurological: She is alert.  Skin: Skin is warm and dry. She is not diaphoretic.  Psychiatric: She has a normal mood and affect. Her behavior is normal.  Nursing note and vitals reviewed.    ED Treatments / Results  Labs (all labs ordered are listed, but only abnormal results are displayed) Labs Reviewed  COMPREHENSIVE METABOLIC PANEL - Abnormal; Notable for the following components:      Result Value   Sodium 134 (*)    Potassium 3.4 (*)    All other components within normal limits  CBC WITH DIFFERENTIAL/PLATELET - Abnormal; Notable for the following components:   WBC 11.9 (*)    RBC 3.42 (*)    Hemoglobin 9.9 (*)    HCT 28.7 (*)    RDW 17.8 (*)    Neutro Abs 8.6 (*)    Monocytes Absolute 1.1 (*)    All other components within normal limits  URINALYSIS, ROUTINE W REFLEX MICROSCOPIC - Abnormal; Notable for the following components:   Ketones, ur 15 (*)    All other components within normal limits  RAPID URINE DRUG SCREEN, HOSP PERFORMED - Abnormal; Notable for the following components:   Tetrahydrocannabinol POSITIVE (*)    Barbiturates   (*)    Value: Result not available. Reagent  lot number recalled by manufacturer.   All other components within normal limits  LIPASE, BLOOD    EKG None  Radiology [place] Abdomen Limited Ruq  Result Date: 12/26/2017 CLINICAL DATA:  Nausea vomiting upper abdominal pain for days. Bilious vomiting EXAM: ULTRASOUND ABDOMEN LIMITED RIGHT UPPER QUADRANT COMPARISON:  CT abdomen pelvis 09/25/2015 FINDINGS: Gallbladder: No gallstones or wall thickening visualized. No sonographic Murphy sign noted by sonographer. Common bile duct: Diameter: 2 mm Liver: No focal lesion identified. Within normal limits in parenchymal echogenicity. Portal vein is patent on color Doppler imaging with normal direction of blood flow towards the liver. IMPRESSION: Negative Electronically Signed   By: Marlan Palauharles  Clark  M.D.   On: 12/26/2017 19:46    Procedures Procedures (including critical care time)  Medications Ordered in ED Medications  metoCLOPramide (REGLAN) injection 10 mg (10 mg Intravenous Given 12/26/17 1825)  sodium chloride 0.9 % bolus 1,000 mL (1,000 mLs Intravenous New Bag/Given 12/26/17 1825)  capsaicin (ZOSTRIX) 0.025 % cream ( Topical Given 12/26/17 1934)     Initial Impression / Assessment and Plan / ED Course  I have reviewed the triage vital signs and the nursing notes.  Pertinent labs & imaging results that were available during my care of the patient were reviewed by me and considered in my medical decision making (see chart for details).  Clinical Course as of Dec 27 2031  Tue Dec 26, 2017  1610 Last value in Care Everywhere was 7.7 on 12/15/17.  Hemoglobin(!): 9.9 [SJ]  2027 Patient states she feels much better.  Tolerating p.o.   [SJ]    Clinical Course User Index [SJ] Joy, Shawn C, PA-C   Patient presents with a few days of nausea and vomiting.  Very little vomiting here in the ED.  Right upper quadrant ultrasound obtained due to patient's description of her vomit as possibly bilious.  Labs overall reassuring.  Ketonuria thought to be due to  some dehydration.  Since patient was admitted via ambulance transfer the last time she was here, she states she did not receive the prescriptions noted in her chart.  The Reglan seemed to be the most effective today so she will receive a prescription for this.  She will receive a prescription for Phenergan suppositories, per documented OB/GYN recommendation last time. He was counseled on the possibility of her nausea and vomiting being due to [facility] hyperemesis. OB/GYN follow-up. Tolerating PO at discharge. The patient was given instructions for home care as well as return precautions. Patient voices understanding of these instructions, accepts the plan, and is comfortable with discharge.  Live, IUP confirmed via [place] 12/11/17 found in Care Everywhere.   Findings and plan of care discussed with Theda Belfast, MD.   Vitals:   12/26/17 1705 12/26/17 1706 12/26/17 1930  BP:  105/66 98/60  Pulse:  66 60  Resp:  18   Temp:  98 F (36.7 C)   TempSrc:  Oral   SpO2:  100% 100%  Weight: 58.1 kg (128 lb)    Height: 5\' 4"  (1.626 m)       Final Clinical Impressions(s) / ED Diagnoses   Final diagnoses:  Vomiting  Non-intractable cyclical vomiting with nausea    ED Discharge Orders        Ordered    promethazine (PHENERGAN) 25 MG suppository  Every 6 hours PRN     12/26/17 2033    ondansetron (ZOFRAN ODT) 4 MG disintegrating tablet  Every 8 hours PRN     12/26/17 2033    metoCLOPramide (REGLAN) 10 MG tablet  Every 6 hours     12/26/17 2033       Anselm Pancoast, PA-C 12/26/17 2037    Tegeler, Canary Brim, MD 12/26/17 2312

## 2017-12-26 NOTE — ED Triage Notes (Signed)
Pt states she is [redacted] weeks pregnant. She was admitted to the hospital earlier this month for hyperemesis. She was feeling better then the symptoms started again 2 days ago.

## 2017-12-26 NOTE — Discharge Instructions (Addendum)
Nausea and vomiting  Hand washing: Wash your hands throughout the day, but especially before and after touching the face, using the restroom, sneezing, coughing, or touching surfaces that have been coughed or sneezed upon. Hydration: Symptoms will be intensified and complicated by dehydration. Dehydration can also extend the duration of symptoms. Drink plenty of fluids and get plenty of rest. You should be drinking at least half a liter of water an hour to stay hydrated. Electrolyte drinks (ex. Gatorade, Powerade, Pedialyte) are also encouraged. You should be drinking enough fluids to make your urine light yellow, almost clear. If this is not the case, you are not drinking enough water. Please note that some of the treatments indicated below will not be effective if you are not adequately hydrated. Diet: Please concentrate on hydration, however, you may introduce food slowly.  Start with a clear liquid diet, progressed to a full liquid diet, and then bland solids as you are able. Pain: Tylenol for pain Nausea/vomiting: May use the Reglan, promethazine suppository, or dissolvable Zofran for nausea/vomiting.  Try one of these medications at a time, do not take them together.  Refrain from marijuana use as this can cause your symptoms. Follow-up: Follow-up with OB/GYN on this matter. Return: Return should you develop a fever, bloody diarrhea, increased abdominal pain, uncontrolled vomiting, or any other major concerns.

## 2017-12-26 NOTE — ED Notes (Signed)
Patient transported to Ultrasound 

## 2017-12-28 DIAGNOSIS — F1210 Cannabis abuse, uncomplicated: Secondary | ICD-10-CM | POA: Insufficient documentation

## 2018-01-11 ENCOUNTER — Inpatient Hospital Stay (HOSPITAL_COMMUNITY)
Admission: AD | Admit: 2018-01-11 | Discharge: 2018-01-12 | Disposition: A | Discharge status: Home / Self Care | Payer: Medicaid Other | Source: Ambulatory Visit | Attending: Obstetrics & Gynecology | Admitting: Obstetrics & Gynecology

## 2018-01-11 ENCOUNTER — Encounter (HOSPITAL_COMMUNITY): Payer: Self-pay | Admitting: *Deleted

## 2018-01-11 ENCOUNTER — Other Ambulatory Visit: Payer: Self-pay

## 2018-01-11 DIAGNOSIS — R1110 Vomiting, unspecified: Secondary | ICD-10-CM | POA: Diagnosis present

## 2018-01-11 DIAGNOSIS — O210 Mild hyperemesis gravidarum: Secondary | ICD-10-CM | POA: Diagnosis not present

## 2018-01-11 DIAGNOSIS — Z79899 Other long term (current) drug therapy: Secondary | ICD-10-CM | POA: Diagnosis not present

## 2018-01-11 DIAGNOSIS — R55 Syncope and collapse: Secondary | ICD-10-CM | POA: Diagnosis not present

## 2018-01-11 DIAGNOSIS — Z87891 Personal history of nicotine dependence: Secondary | ICD-10-CM | POA: Diagnosis not present

## 2018-01-11 DIAGNOSIS — R1010 Upper abdominal pain, unspecified: Secondary | ICD-10-CM | POA: Insufficient documentation

## 2018-01-11 DIAGNOSIS — K117 Disturbances of salivary secretion: Secondary | ICD-10-CM

## 2018-01-11 DIAGNOSIS — K219 Gastro-esophageal reflux disease without esophagitis: Secondary | ICD-10-CM

## 2018-01-11 DIAGNOSIS — O26891 Other specified pregnancy related conditions, first trimester: Secondary | ICD-10-CM | POA: Diagnosis not present

## 2018-01-11 DIAGNOSIS — Z3A11 11 weeks gestation of pregnancy: Secondary | ICD-10-CM | POA: Insufficient documentation

## 2018-01-11 DIAGNOSIS — E861 Hypovolemia: Secondary | ICD-10-CM | POA: Insufficient documentation

## 2018-01-11 HISTORY — DX: Unspecified infectious disease: B99.9

## 2018-01-11 HISTORY — DX: Headache: R51

## 2018-01-11 HISTORY — DX: Headache, unspecified: R51.9

## 2018-01-11 HISTORY — DX: Unspecified ovarian cyst, unspecified side: N83.209

## 2018-01-11 HISTORY — DX: Unspecified abnormal cytological findings in specimens from vagina: R87.629

## 2018-01-11 HISTORY — DX: Anemia, unspecified: D64.9

## 2018-01-11 LAB — URINALYSIS, ROUTINE W REFLEX MICROSCOPIC
BILIRUBIN URINE: NEGATIVE
Glucose, UA: NEGATIVE mg/dL
Hgb urine dipstick: NEGATIVE
Ketones, ur: 80 mg/dL — AB
LEUKOCYTES UA: NEGATIVE
Nitrite: NEGATIVE
Protein, ur: 30 mg/dL — AB
Specific Gravity, Urine: 1.025 (ref 1.005–1.030)
pH: 8 (ref 5.0–8.0)

## 2018-01-11 MED ORDER — DEXAMETHASONE SODIUM PHOSPHATE 10 MG/ML IJ SOLN
10.0000 mg | Freq: Once | INTRAMUSCULAR | Status: AC
  Administered 2018-01-11: 10 mg via INTRAVENOUS
  Filled 2018-01-11: qty 1

## 2018-01-11 MED ORDER — FAMOTIDINE IN NACL 20-0.9 MG/50ML-% IV SOLN
20.0000 mg | Freq: Once | INTRAVENOUS | Status: AC
  Administered 2018-01-11: 20 mg via INTRAVENOUS
  Filled 2018-01-11: qty 50

## 2018-01-11 MED ORDER — GLYCOPYRROLATE 0.2 MG/ML IJ SOLN: 0.5000 mg | Freq: Once | INTRAMUSCULAR | Status: DC

## 2018-01-11 MED ORDER — PROMETHAZINE HCL 25 MG/ML IJ SOLN
25.0000 mg | Freq: Once | INTRAMUSCULAR | Status: AC
  Administered 2018-01-11: 25 mg via INTRAVENOUS
  Filled 2018-01-11: qty 1

## 2018-01-11 MED ORDER — SODIUM CHLORIDE 0.9 % IV SOLN
INTRAVENOUS | Status: DC
  Administered 2018-01-11: via INTRAVENOUS

## 2018-01-11 MED ORDER — GLYCOPYRROLATE 0.2 MG/ML IJ SOLN
0.2000 mg | Freq: Once | INTRAMUSCULAR | Status: AC
  Administered 2018-01-12: 0.2 mg via INTRAVENOUS
  Filled 2018-01-11: qty 1

## 2018-01-11 MED ORDER — ONDANSETRON HCL 4 MG/2ML IJ SOLN
4.0000 mg | Freq: Once | INTRAMUSCULAR | Status: AC
  Administered 2018-01-11: 4 mg via INTRAVENOUS
  Filled 2018-01-11: qty 2

## 2018-01-11 MED ORDER — PROMETHAZINE HCL 25 MG RE SUPP: 25.0000 mg | Freq: Once | RECTAL | Status: DC

## 2018-01-11 MED ORDER — M.V.I. ADULT IV INJ
Freq: Once | INTRAVENOUS | Status: AC
  Administered 2018-01-11: 21:00:00 via INTRAVENOUS
  Filled 2018-01-11: qty 1000

## 2018-01-11 MED ORDER — GLYCOPYRROLATE 0.2 MG/ML IJ SOLN: 0.2000 mg | Freq: Once | INTRAMUSCULAR | Status: DC

## 2018-01-11 NOTE — MAU Note (Signed)
Pt assisted from car via wc. Ongoing vomiting.  Has not kept anything down in a wk. Passed out in the shower this morning, had got really dizzy. When she came to she was on her back. Went to see Dr Exie Parodyourn,  He wanted her to continue meds, not to admit.  Has been adm at [facility].

## 2018-01-11 NOTE — MAU Note (Cosign Needed Addendum)
Faculty [facility] OB/GYN Attending MAU Note  Chief Complaint: Fall; Emesis; Abdominal Pain; and Back Pain    SUBJECTIVE Sarah Johns is a 27 y.o. [id number] at [redacted]w[redacted]d  who presents with a cc: of nausea/vomiting.  Pt reports that this most recent episode of nausea began on Monday and describes it as a dizzyness, queasiness in quality.  Pt reports that nausea is associated with vomiting and she vomits and dry heaves persistently throughout the day. Pt reports that the vomitus taste acidic, and vomitus is sometimes yellow, and she has noticed blood intermixed. Pt reports throat soreness because of the excessive vomiting.    Pt reports that she has been unable to keep things down.  Pt reports unintentional weight loss (approximately 10-12pbs, 118lbs down from 130lb)  Pt reports chills and blurry vision.  Pt denies fever.  Pt reports that she has been constipated since Saturday and typically has bm qd.  Pt reports her urine today was darker.  Pt reports loss of consciousness today In the shower.  Pt reports that she hit her head and back when she fell.  Pt reports loss of consciousness with fall.  Pt endorses that she believes she was unconscious for about 10 minutes.  Pt reports associated symptoms of headache and diffuse abdominal pain that radiates to her back.    Pt reports that in addition to metoclopramide, she is taking several anti-emetics daily (use of scopolamine patch q72hrs, promethazine per rectum, 4mg  ondansetron q8hrs, and chlorpromazine) , and appropriately,  staggering administration , but not receiving any relief from them and oftentimes vomiting within 10 minutes of administration.  Past Medical History:  Diagnosis Date  . Anemia   . Cannabinoid hyperemesis syndrome (HCC)   . Fever blister   . Gastritis   . Headache   . Herpes   . Infection    UTI  . Ovarian cyst   . Vaginal Pap smear, abnormal    OB History  Gravida Para Term Preterm AB Living  5 0     4 0  SAB TAB Ectopic  Multiple Live Births  3 0 1   0    # Outcome Date GA Lbr Len/2nd Weight Sex Delivery Anes PTL Lv  5 Current           4 SAB           3 Ectopic           2 SAB           1 SAB            Past Surgical History:  Procedure Laterality Date  . DIAGNOSTIC LAPAROSCOPY WITH REMOVAL OF ECTOPIC PREGNANCY N/A 09/21/2015   Procedure: DIAGNOSTIC LAPAROSCOPY Salpingectomy with REMOVAL OF ECTOPIC PREGNANCY;  Surgeon: Lesly Dukes, MD;  Location: [facility];  Service: Gynecology;  Laterality: N/A;  . DILATION AND CURETTAGE OF UTERUS     Social History   Socioeconomic History  . Marital status: Single    Spouse name: Not on file  . Number of children: Not on file  . Years of education: Not on file  . Highest education level: Not on file  Occupational History  . Not on file  Social Needs  . Financial resource strain: Not on file  . Food insecurity:    Worry: Not on file    Inability: Not on file  . Transportation needs:    Medical: Not on file    Non-medical: Not on file  Tobacco Use  .  Smoking status: Former Smoker    Types: Cigarettes  . Smokeless tobacco: Never Used  . Tobacco comment: quit at 5 wks  Substance and Sexual Activity  . Alcohol use: No  . Drug use: Not Currently    Types: Marijuana    Comment: last was mid June  . Sexual activity: Yes    Birth control/protection: None  Lifestyle  . Physical activity:    Days per week: Not on file    Minutes per session: Not on file  . Stress: Not on file  Relationships  . Social connections:    Talks on phone: Not on file    Gets together: Not on file    Attends religious service: Not on file    Active member of club or organization: Not on file    Attends meetings of clubs or organizations: Not on file    Relationship status: Not on file  . Intimate partner violence:    Fear of current or ex partner: Not on file    Emotionally abused: Not on file    Physically abused: Not on file    Forced sexual activity: Not on file   Other Topics Concern  . Not on file  Social History Narrative  . Not on file   No current facility-administered medications on file prior to encounter.    Current Outpatient Medications on File Prior to Encounter  Medication Sig Dispense Refill  . chlorproMAZINE (THORAZINE) 25 MG tablet Take by mouth.    . docusate sodium (COLACE) 100 MG capsule Colace 100 mg capsule  Take 1 capsule twice a day by oral route for 90 days.    Marland Kitchen doxylamine, Sleep, (UNISOM) 25 MG tablet Take by mouth.    . IRON PO Take by mouth.    . metoCLOPramide (REGLAN) 10 MG tablet Take by mouth.    . metoCLOPramide (REGLAN) 10 MG tablet Take 1 tablet (10 mg total) by mouth every 6 (six) hours. 30 tablet 0  . ondansetron (ZOFRAN ODT) 4 MG disintegrating tablet Take 1 tablet (4 mg total) by mouth every 8 (eight) hours as needed for nausea or vomiting. 20 tablet 0  . promethazine (PHENERGAN) 25 MG suppository Place 1 suppository (25 mg total) rectally 4 (four) times daily for 7 days. 28 suppository 0  . pyridOXINE (VITAMIN B-6) 25 MG tablet Take by mouth.    Marland Kitchen scopolamine (TRANSDERM-SCOP) 1 MG/3DAYS Place onto the skin.    . Doxylamine-Pyridoxine (DICLEGIS) 10-10 MG TBEC Diclegis 10 mg-10 mg tablet,delayed release  Take 2 tablets every day by oral route for 30 days.    Marland Kitchen ibuprofen (ADVIL,MOTRIN) 600 MG tablet Take 1 tablet (600 mg total) by mouth every 6 (six) hours as needed. 30 tablet 1  . oxyCODONE-acetaminophen (PERCOCET/ROXICET) 5-325 MG tablet Take 1-2 tablets by mouth every 6 (six) hours as needed. 15 tablet 0  . promethazine (PHENERGAN) 25 MG suppository Place 1 suppository (25 mg total) rectally every 6 (six) hours as needed for nausea or vomiting. 12 each 0  . valACYclovir HCl (VALTREX PO) Valtrex     Allergies  Allergen Reactions  . Cherry Itching and Rash    ROS: Pertinent items in HPI  OBJECTIVE Patient Vitals for the past 24 hrs:  BP Temp Temp src Pulse Resp SpO2  01/11/18 1811 (!) 97/56 98.2 F  (36.8 C) Oral 79 (!) 24 98 %  01/11/18 1808 - - - - - 98 %   CONSTITUTIONAL: Well-developed, tired appearing female in no acute  distress, spitting intermittently iinto a vomit bag.  HENT:  Normocephalic, atraumatic, head, non-tender to palpation, no bruising or tender areas seen. NECK: Normal range of motion, supple, SKIN: Skin is warm and dry. No rash noted, but hyperpigmentation in malar distribution on face  No erythema. No pallor.  NEUROLGIC: Alert and oriented to person, place, and time. Normal reflexes, muscle tone coordination. No cranial nerve deficit noted. RESPIRATORY: Effort sounds normal, no problems with respiration noted. ABDOMEN:  Gravid,  With tenderness with palpation, no rebound or guarding.  MUSCULOSKELETAL: Normal range of motion. No tenderness.  No edema. BACK:  No bruising seen, no tender areas, reports she has pain in left lower side of her back.  FHR (doppler) by Nolene Bernheimerri Burleson, NP : 166bpm LAB RESULTS  IMAGING [place] Abdomen Limited Ruq  Result Date: 12/26/2017 CLINICAL DATA:  Nausea vomiting upper abdominal pain for days. Bilious vomiting EXAM: ULTRASOUND ABDOMEN LIMITED RIGHT UPPER QUADRANT COMPARISON:  CT abdomen pelvis 09/25/2015 FINDINGS: Gallbladder: No gallstones or wall thickening visualized. No sonographic Murphy sign noted by sonographer. Common bile duct: Diameter: 2 mm Liver: No focal lesion identified. Within normal limits in parenchymal echogenicity. Portal vein is patent on color Doppler imaging with normal direction of blood flow towards the liver. IMPRESSION: Negative Electronically Signed   By: Marlan Palauharles  Clark M.D.   On: 12/26/2017 19:46    MAU COURSE Symptomatically hypovolemic G5P0040 w. Ptyalism and Hyperemsis  initial replacement fluid therapy with Ringer's lactate with Pheneran 25 md and 1000 cc D5LR with one amp multivitamins Analgesic for headache PRN (likely tylenol 100mg  p/o once pt has finished receiving IV fluids) Attempted sprite and  crackers but vomited the sprite. Care assumed by M. Mayford KnifeWilliams, CNM at 2000  ASSESSMENT Hyperemesis in 26yo [redacted]W[redacted]D G5P0040  Syncopal episode this am - initial encounter  PLAN Urinalysis when pt is able  Discharge home with review of medications for vomiting. Followup with your OB MD - Dr. Shawnie Ponsorn  I have examined the patient along with the medical student and I have supervised the care and documentation.  I agree with this note.  Currie ParisBurleson, Terri L, NP 01/11/2018 8:28 PM   Assumed care Discussed adding Robinul to assist in decreasing saliva Also discussed adding Protonix, due to history of gastritis. Patient able to keep down small amounts of fluids now.  Will discharge home and have her call Dr Shawnie Ponsorn in the morning  Aviva SignsWilliams, Christna Kulick L, CNM

## 2018-01-11 NOTE — MAU Note (Signed)
Itching started immediately after administering decadron 10mg  IV and lasted for about one minute.

## 2018-01-12 DIAGNOSIS — O26891 Other specified pregnancy related conditions, first trimester: Secondary | ICD-10-CM | POA: Diagnosis not present

## 2018-01-12 LAB — COMPREHENSIVE METABOLIC PANEL
ALBUMIN: 3.3 g/dL — AB (ref 3.5–5.0)
ALT: 9 U/L (ref 0–44)
ANION GAP: 8 (ref 5–15)
AST: 17 U/L (ref 15–41)
Alkaline Phosphatase: 49 U/L (ref 38–126)
BILIRUBIN TOTAL: 0.3 mg/dL (ref 0.3–1.2)
BUN: 7 mg/dL (ref 6–20)
CO2: 19 mmol/L — ABNORMAL LOW (ref 22–32)
Calcium: 8.6 mg/dL — ABNORMAL LOW (ref 8.9–10.3)
Chloride: 105 mmol/L (ref 98–111)
Creatinine, Ser: 0.45 mg/dL (ref 0.44–1.00)
GFR calc Af Amer: >60 mL/min (ref >60)
GFR calc non Af Amer: >60 mL/min (ref >60)
GLUCOSE: 106 mg/dL — AB (ref 70–99)
POTASSIUM: 3.7 mmol/L (ref 3.5–5.1)
Sodium: 132 mmol/L — ABNORMAL LOW (ref 135–145)
TOTAL PROTEIN: 6.5 g/dL (ref 6.5–8.1)

## 2018-01-12 MED ORDER — FENTANYL CITRATE (PF) 100 MCG/2ML IJ SOLN
50.0000 ug | Freq: Once | INTRAMUSCULAR | Status: AC
  Administered 2018-01-12: 50 ug via INTRAVENOUS
  Filled 2018-01-12: qty 2

## 2018-01-12 MED ORDER — PANTOPRAZOLE SODIUM 20 MG PO TBEC: 20.0000 mg | DELAYED_RELEASE_TABLET | Freq: Every day | ORAL | 0 refills | Status: AC

## 2018-01-12 MED ORDER — PROMETHAZINE HCL 25 MG/ML IJ SOLN
25.0000 mg | Freq: Once | INTRAMUSCULAR | Status: AC
  Administered 2018-01-12: 25 mg via INTRAMUSCULAR
  Filled 2018-01-12: qty 1

## 2018-01-12 MED ORDER — GLYCOPYRROLATE 1 MG PO TABS: 1.0000 mg | ORAL_TABLET | Freq: Three times a day (TID) | ORAL | 0 refills | Status: AC

## 2018-01-12 NOTE — Discharge Instructions (Signed)
Eating Plan for Hyperemesis Gravidarum °Hyperemesis gravidarum is a severe form of morning sickness. Because this condition causes severe nausea and vomiting, it can lead to dehydration, malnutrition, and weight loss. One way to lessen the symptoms of nausea and vomiting is to follow the eating plan for hyperemesis gravidarum. It is often used along with prescribed medicines to control your symptoms. °What can I do to relieve my symptoms? °Listen to your body. Everyone is different and has different preferences. Find what works best for you. Take any of the following actions that are helpful to you: °· Eat and drink slowly. °· Eat 5-6 small meals daily instead of 3 large meals. °· Eat crackers before you get out of bed in the morning. °· Try having a snack in the middle of the night. °· Starchy foods are usually tolerated well. Examples include cereal, toast, bread, potatoes, pasta, rice, and pretzels. °· Ginger may help with nausea. Add ¼ tsp ground ginger to hot tea or choose ginger tea. °· Try drinking 100% fruit juice or an electrolyte drink. An electrolyte drink contains sodium, potassium, and chloride. °· Continue to take your prenatal vitamins as told by your health care provider. If you are having trouble taking your prenatal vitamins, talk with your health care provider about different options. °· Include at least 1 serving of protein with your meals and snacks. Protein options include meats or poultry, beans, nuts, eggs, and yogurt. Try eating a protein-rich snack before bed. Examples of these snacks include cheese and crackers or half of a peanut butter or turkey sandwich. °· Consider eliminating foods that trigger your symptoms. These may include spicy foods, coffee, high-fat foods, very sweet foods, and acidic foods. °· Try meals that have more protein combined with bland, salty, lower-fat, and dry foods, such as nuts, seeds, pretzels, crackers, and cereal. °· Talk with your healthcare provider about  starting a supplement of vitamin B6. °· Have fluids that are cold, clear, and carbonated or sour. Examples include lemonade, ginger ale, lemon-lime soda, ice water, and sparkling water. °· Try lemon or mint tea. °· Try brushing your teeth or using a mouth rinse after meals. ° °What should I avoid to reduce my symptoms? °Avoiding some of the following things may help reduce your symptoms. °· Foods with strong smells. Try eating meals in well-ventilated areas that are free of odors. °· Drinking water or other beverages with meals. Try not to drink anything during the 30 minutes before and after your meals. °· Drinking more than 1 cup of fluid at a time. Sometimes using a straw helps. °· Fried or high-fat foods, such as butter and cream sauces. °· Spicy foods. °· Skipping meals as best as you can. Nausea can be more intense on an empty stomach. If you cannot tolerate food at that time, do not force it. Try sucking on ice chips or other frozen items, and make up for missed calories later. °· Lying down within 2 hours after eating. °· Environmental triggers. These may include smoky rooms, closed spaces, rooms with strong smells, warm or humid places, overly loud and noisy rooms, and rooms with motion or flickering lights. °· Quick and sudden changes in your movement. ° °This information is not intended to replace advice given to you by your health care provider. Make sure you discuss any questions you have with your health care provider. °Document Released: 03/27/2007 Document Revised: 01/27/2016 Document Reviewed: 12/29/2015 °Elsevier Interactive Patient Education © 2018 [employer] ° °

## 2018-10-19 ENCOUNTER — Encounter (HOSPITAL_COMMUNITY): Payer: Self-pay

## 2020-12-16 ENCOUNTER — Other Ambulatory Visit: Payer: Self-pay

## 2020-12-16 ENCOUNTER — Emergency Department (HOSPITAL_BASED_OUTPATIENT_CLINIC_OR_DEPARTMENT_OTHER)
Admission: EM | Admit: 2020-12-16 | Discharge: 2020-12-16 | Disposition: A | Discharge status: Home / Self Care | Payer: 59 | Attending: Emergency Medicine | Admitting: Emergency Medicine

## 2020-12-16 ENCOUNTER — Encounter (HOSPITAL_BASED_OUTPATIENT_CLINIC_OR_DEPARTMENT_OTHER): Payer: Self-pay | Admitting: Emergency Medicine

## 2020-12-16 ENCOUNTER — Emergency Department (HOSPITAL_BASED_OUTPATIENT_CLINIC_OR_DEPARTMENT_OTHER): Payer: 59

## 2020-12-16 DIAGNOSIS — R350 Frequency of micturition: Secondary | ICD-10-CM | POA: Diagnosis not present

## 2020-12-16 DIAGNOSIS — G8929 Other chronic pain: Secondary | ICD-10-CM | POA: Insufficient documentation

## 2020-12-16 DIAGNOSIS — M461 Sacroiliitis, not elsewhere classified: Secondary | ICD-10-CM | POA: Diagnosis present

## 2020-12-16 DIAGNOSIS — R3129 Other microscopic hematuria: Secondary | ICD-10-CM

## 2020-12-16 DIAGNOSIS — R319 Hematuria, unspecified: Secondary | ICD-10-CM | POA: Insufficient documentation

## 2020-12-16 DIAGNOSIS — Z87891 Personal history of nicotine dependence: Secondary | ICD-10-CM | POA: Insufficient documentation

## 2020-12-16 DIAGNOSIS — M545 Low back pain, unspecified: Secondary | ICD-10-CM

## 2020-12-16 LAB — URINALYSIS, MICROSCOPIC (REFLEX)

## 2020-12-16 LAB — URINALYSIS, ROUTINE W REFLEX MICROSCOPIC
Bilirubin Urine: NEGATIVE
Glucose, UA: NEGATIVE mg/dL
Ketones, ur: NEGATIVE mg/dL
Leukocytes,Ua: NEGATIVE
Nitrite: NEGATIVE
Protein, ur: NEGATIVE mg/dL
Specific Gravity, Urine: 1.025 (ref 1.005–1.030)
pH: 6 (ref 5.0–8.0)

## 2020-12-16 LAB — PREGNANCY, URINE: Preg Test, Ur: NEGATIVE

## 2020-12-16 MED ORDER — NAPROXEN 375 MG PO TABS: 375.0000 mg | ORAL_TABLET | Freq: Two times a day (BID) | ORAL | 0 refills | Status: AC

## 2020-12-16 MED ORDER — LIDOCAINE 5 % EX PTCH: 1.0000 | MEDICATED_PATCH | CUTANEOUS | 0 refills | Status: AC

## 2020-12-16 MED ORDER — METAXALONE 800 MG PO TABS: 800.0000 mg | ORAL_TABLET | Freq: Three times a day (TID) | ORAL | 0 refills | Status: AC

## 2020-12-16 MED ORDER — LIDOCAINE 5 % EX PTCH
2.0000 | MEDICATED_PATCH | CUTANEOUS | Status: DC
  Administered 2020-12-16: 2 via TRANSDERMAL
  Filled 2020-12-16: qty 2

## 2020-12-16 MED ORDER — KETOROLAC TROMETHAMINE 60 MG/2ML IM SOLN
30.0000 mg | Freq: Once | INTRAMUSCULAR | Status: AC
  Administered 2020-12-16: 30 mg via INTRAMUSCULAR
  Filled 2020-12-16: qty 2

## 2020-12-16 NOTE — ED Triage Notes (Signed)
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

## 2020-12-16 NOTE — ED Provider Notes (Signed)
[facility] EMERGENCY DEPARTMENT Provider Note   CSN: [id number] Arrival date & time: 12/16/20  0456     History Chief Complaint  Patient presents with   Back Pain    Sarah Johns is a 30 y.o. female.  The history is provided by the patient.  Back Pain Location:  Sacro-iliac joint Quality:  Cramping Radiates to:  L posterior upper leg and R posterior upper leg Pain severity:  Severe Pain is:  Same all the time Onset quality:  Gradual Duration: days. Timing:  Constant Progression:  Unchanged Chronicity:  Chronic (acute on chronic, MRi for same 09/2019 was normal) Context: lifting heavy objects   Context: not occupational injury   Relieved by:  Nothing Worsened by:  Nothing Associated symptoms: no abdominal pain, no abdominal swelling, no bladder incontinence, no bowel incontinence, no chest pain, no dysuria, no fever, no headaches, no numbness, no paresthesias, no pelvic pain, no perianal numbness, no tingling, no weakness and no weight loss   Risk factors: no hx of cancer       Past Medical History:  Diagnosis Date   Anemia    Cannabinoid hyperemesis syndrome    Fever blister    Gastritis    Headache    Herpes    Infection    UTI   Ovarian cyst    Vaginal Pap smear, abnormal     Patient Active Problem List   Diagnosis Date Noted   Cannabis abuse 12/28/2017   Hyperemesis arising during pregnancy 12/13/2017    Past Surgical History:  Procedure Laterality Date   DIAGNOSTIC LAPAROSCOPY WITH REMOVAL OF ECTOPIC PREGNANCY N/A 09/21/2015   Procedure: DIAGNOSTIC LAPAROSCOPY Salpingectomy with REMOVAL OF ECTOPIC PREGNANCY;  Surgeon: Lesly Dukes, MD;  Location: [facility];  Service: Gynecology;  Laterality: N/A;   DILATION AND CURETTAGE OF UTERUS       OB History     Gravida  5   Para  0   Term      Preterm      AB  4   Living  0      SAB  3   IAB  0   Ectopic  1   Multiple      Live Births  0           Family History   Problem Relation Age of Onset   Diabetes Mother    Hypertension Mother    Asthma Mother    Cancer Father        lung   Cancer Paternal Grandmother     Social History   Tobacco Use   Smoking status: Former    Pack years: 0.00    Types: Cigarettes   Smokeless tobacco: Never   Tobacco comments:    quit at 5 wks  Substance Use Topics   Alcohol use: No   Drug use: Not Currently    Types: Marijuana    Comment: last was mid June    Home Medications Prior to Admission medications   Medication Sig Start Date End Date Taking? Authorizing Provider  lidocaine (LIDODERM) 5 % Place 1 patch onto the skin daily. Remove & Discard patch within 12 hours or as directed by MD 12/16/20  Yes Shloimy Michalski, MD  metaxalone (SKELAXIN) 800 MG tablet Take 1 tablet (800 mg total) by mouth 3 (three) times daily. 12/16/20  Yes Norma Ignasiak, MD  naproxen (NAPROSYN) 375 MG tablet Take 1 tablet (375 mg total) by mouth 2 (two) times daily with  a meal. 12/16/20  Yes Jozey Janco, MD  capsaicin (ZOSTRIX) 0.025 % cream Apply 1 application topically 2 (two) times daily.    [provider]  chlorproMAZINE (THORAZINE) 25 MG tablet Take 25 mg by mouth 3 (three) times daily as needed for nausea.    [provider]  docusate sodium (COLACE) 100 MG capsule Take 100 mg by mouth 2 (two) times daily as needed for mild constipation.    [provider]  ferrous sulfate 325 (65 FE) MG tablet Take 325 mg by mouth daily with breakfast.    [provider]  glycopyrrolate (ROBINUL) 1 MG tablet Take 1 tablet (1 mg total) by mouth 3 (three) times daily. 01/12/18   Aviva Signs, CNM  metoCLOPramide (REGLAN) 10 MG tablet Take 1 tablet (10 mg total) by mouth every 6 (six) hours. Patient taking differently: Take 10 mg by mouth every 6 (six) hours as needed for nausea.  12/26/17   Joy, Shawn C, PA-C  ondansetron (ZOFRAN ODT) 4 MG disintegrating tablet Take 1 tablet (4 mg total) by mouth every 8  (eight) hours as needed for nausea or vomiting. 12/26/17   Joy, Shawn C, PA-C  pantoprazole (PROTONIX) 20 MG tablet Take 1 tablet (20 mg total) by mouth daily. 01/12/18   Aviva Signs, CNM  Prenatal Vit-Fe Fumarate-FA (PRENATAL MULTIVITAMIN) TABS tablet Take 1 tablet by mouth daily at 12 noon.    [provider]  promethazine (PHENERGAN) 25 MG suppository Place 1 suppository (25 mg total) rectally 4 (four) times daily for 7 days. 12/12/17 01/11/18  Mortis, Sharyon Medicus, PA-C    Allergies    Cherry and Decadron [dexamethasone]  Review of Systems   Review of Systems  Constitutional:  Negative for fever and weight loss.  HENT:  Negative for drooling.   Eyes:  Negative for redness.  Respiratory:  Negative for wheezing.   Cardiovascular:  Negative for chest pain.  Gastrointestinal:  Negative for abdominal pain and bowel incontinence.  Genitourinary:  Negative for bladder incontinence, difficulty urinating, dysuria and pelvic pain.  Musculoskeletal:  Positive for back pain. Negative for neck pain and neck stiffness.  Skin:  Negative for rash.  Neurological:  Negative for tingling, weakness, numbness, headaches and paresthesias.  Psychiatric/Behavioral:  Negative for agitation.   All other systems reviewed and are negative.  Physical Exam Updated Vital Signs BP 127/82 (BP Location: Right Arm)   Pulse (!) 102   Temp 98.9 F (37.2 C) (Oral)   Resp 20   Ht 5\' 4"  (1.626 m)   SpO2 98%   BMI 21.97 kg/m   Physical Exam Vitals and nursing note reviewed.  Constitutional:      General: She is not in acute distress.    Appearance: Normal appearance.  HENT:     Head: Normocephalic and atraumatic.     Nose: Nose normal.  Eyes:     Conjunctiva/sclera: Conjunctivae normal.     Pupils: Pupils are equal, round, and reactive to light.  Cardiovascular:     Rate and Rhythm: Normal rate and regular rhythm.     Pulses: Normal pulses.     Heart sounds: Normal heart sounds.  Pulmonary:      Effort: Pulmonary effort is normal.     Breath sounds: Normal breath sounds.  Abdominal:     General: Abdomen is flat. Bowel sounds are normal.     Palpations: Abdomen is soft.     Tenderness: There is no abdominal tenderness. There is no guarding.  Musculoskeletal:        General: Normal range of motion.     Cervical back: Normal, normal range of motion and neck supple.     Thoracic back: Normal.     Lumbar back: Normal.  Skin:    General: Skin is warm and dry.     Capillary Refill: Capillary refill takes less than 2 seconds.  Neurological:     General: No focal deficit present.     Mental Status: She is alert and oriented to person, place, and time.     Deep Tendon Reflexes: Reflexes normal.  Psychiatric:        Mood and Affect: Mood normal.        Behavior: Behavior normal.    ED Results / Procedures / Treatments   Labs (all labs ordered are listed, but only abnormal results are displayed) Labs Reviewed  PREGNANCY, URINE  URINALYSIS, ROUTINE W REFLEX MICROSCOPIC    EKG None  Radiology No results found.  Procedures Procedures   Medications Ordered in ED Medications  lidocaine (LIDODERM) 5 % 2 patch (has no administration in time range)  ketorolac (TORADOL) injection 30 mg (has no administration in time range)    ED Course  I have reviewed the triage vital signs and the nursing notes.  Pertinent labs & imaging results that were available during my care of the patient were reviewed by me and considered in my medical decision making (see chart for details).   Patient has had multiple imaging studies of her low back for this same issue.  There is not point tenderness, there are no red flags on history or exam.  Conservative management with NSAIDs and muscle relaxers and follow up with your PMD.    Sid Falconmber Peerson was evaluated in Emergency Department on 12/16/2020 for the symptoms described in the history of present illness. She was evaluated in the context of the  global COVID-19 pandemic, which necessitated consideration that the patient might be at risk for infection with the SARS-CoV-2 virus that causes COVID-19. Institutional protocols and algorithms that pertain to the evaluation of patients at risk for COVID-19 are in a state of rapid change based on information released by regulatory bodies including the CDC and federal and state organizations. These policies and algorithms were followed during the patient's care in the ED.  Final Clinical Impression(s) / ED Diagnoses Final diagnoses:  None    Return for intractable cough, coughing up blood, fevers > 100.4 unrelieved by medication, shortness of breath, intractable vomiting, chest pain, shortness of breath, weakness, numbness, changes in speech, facial asymmetry, abdominal pain, passing out, Inability to tolerate liquids or food, cough, altered mental status or any concerns. No signs of systemic illness or infection. The patient is nontoxic-appearing on exam and vital signs are within normal limits. I have reviewed the triage vital signs and the nursing notes. Pertinent labs & imaging results that were available during my care of the patient were reviewed by me and considered in my medical decision making (see chart for details). After history, exam, and medical workup I feel the patient has been appropriately medically screened and is safe for discharge home. Pertinent diagnoses were discussed with the patient. Patient was given return precautions.  Rx / DC Orders ED Discharge Orders          Ordered    lidocaine (LIDODERM) 5 %  Every 24 hours        12/16/20 0514    metaxalone (SKELAXIN) 800 MG tablet  3 times daily        12/16/20 0514    naproxen (NAPROSYN) 375 MG tablet  2 times daily with meals        12/16/20 0514             Davin Archuletta, MD 12/16/20 0530
